# Patient Record
Sex: Female | Born: 1975 | Race: Black or African American | Hispanic: No | Marital: Married | Smoking: Never smoker
Health system: Southern US, Community
[De-identification: ages and names within clinical notes are randomized; demographics above are authoritative.]

## PROBLEM LIST (undated history)

## (undated) DIAGNOSIS — I10 Essential (primary) hypertension: Secondary | ICD-10-CM

## (undated) DIAGNOSIS — R079 Chest pain, unspecified: Secondary | ICD-10-CM

## (undated) DIAGNOSIS — T7840XA Allergy, unspecified, initial encounter: Secondary | ICD-10-CM

## (undated) HISTORY — DX: Allergy, unspecified, initial encounter: T78.40XA

## (undated) HISTORY — DX: Essential (primary) hypertension: I10

## (undated) HISTORY — DX: Chest pain, unspecified: R07.9

---

## 2007-05-18 ENCOUNTER — Emergency Department (HOSPITAL_COMMUNITY): Admission: EM | Admit: 2007-05-18 | Discharge: 2007-05-19 | Payer: Self-pay | Admitting: Emergency Medicine

## 2007-06-16 ENCOUNTER — Emergency Department (HOSPITAL_COMMUNITY): Admission: EM | Admit: 2007-06-16 | Discharge: 2007-06-16 | Payer: Self-pay | Admitting: Family Medicine

## 2008-01-21 ENCOUNTER — Emergency Department (HOSPITAL_COMMUNITY): Admission: EM | Admit: 2008-01-21 | Discharge: 2008-01-21 | Payer: Self-pay | Admitting: Emergency Medicine

## 2009-06-27 ENCOUNTER — Observation Stay (HOSPITAL_COMMUNITY): Admission: EM | Admit: 2009-06-27 | Discharge: 2009-06-28 | Payer: Self-pay | Admitting: Emergency Medicine

## 2009-06-27 ENCOUNTER — Emergency Department (HOSPITAL_COMMUNITY): Admission: EM | Admit: 2009-06-27 | Discharge: 2009-06-27 | Payer: Self-pay | Admitting: Emergency Medicine

## 2010-01-31 ENCOUNTER — Emergency Department (HOSPITAL_COMMUNITY): Admission: EM | Admit: 2010-01-31 | Discharge: 2010-01-31 | Payer: Self-pay | Admitting: Family Medicine

## 2010-08-18 LAB — POCT CARDIAC MARKERS: Troponin i, poc: <0.05 ng/mL (ref 0.00–0.09)

## 2010-08-18 LAB — BASIC METABOLIC PANEL
BUN: 4 mg/dL — ABNORMAL LOW (ref 6–23)
CO2: 21 mEq/L (ref 19–32)
CO2: 24 mEq/L (ref 19–32)
Calcium: 8.7 mg/dL (ref 8.4–10.5)
Chloride: 106 mEq/L (ref 96–112)
Creatinine, Ser: 0.65 mg/dL (ref 0.4–1.2)
GFR calc non Af Amer: >60 mL/min (ref >60)
Potassium: 3.4 mEq/L — ABNORMAL LOW (ref 3.5–5.1)
Potassium: 3.7 mEq/L (ref 3.5–5.1)
Sodium: 138 mEq/L (ref 135–145)

## 2010-08-18 LAB — CBC
HCT: 38.8 % (ref 36.0–46.0)
Hemoglobin: 13.4 g/dL (ref 12.0–15.0)
MCHC: 34.5 g/dL (ref 30.0–36.0)
RBC: 4.33 MIL/uL (ref 3.87–5.11)
RDW: 13.8 % (ref 11.5–15.5)

## 2010-08-18 LAB — DIFFERENTIAL
Lymphocytes Relative: 43 % (ref 12–46)
Monocytes Absolute: 0.4 10*3/uL (ref 0.1–1.0)
Neutro Abs: 2.9 10*3/uL (ref 1.7–7.7)
Neutrophils Relative %: 49 % (ref 43–77)

## 2011-09-08 ENCOUNTER — Emergency Department (HOSPITAL_COMMUNITY)
Admission: EM | Admit: 2011-09-08 | Discharge: 2011-09-08 | Disposition: A | Discharge status: Home / Self Care | Payer: BC Managed Care – PPO | Attending: Emergency Medicine | Admitting: Emergency Medicine

## 2011-09-08 ENCOUNTER — Encounter (HOSPITAL_COMMUNITY): Payer: Self-pay | Admitting: Emergency Medicine

## 2011-09-08 DIAGNOSIS — L299 Pruritus, unspecified: Secondary | ICD-10-CM | POA: Insufficient documentation

## 2011-09-08 DIAGNOSIS — L259 Unspecified contact dermatitis, unspecified cause: Secondary | ICD-10-CM | POA: Insufficient documentation

## 2011-09-08 DIAGNOSIS — T7840XA Allergy, unspecified, initial encounter: Secondary | ICD-10-CM

## 2011-09-08 MED ORDER — PREDNISONE 20 MG PO TABS: 20.0000 mg | ORAL_TABLET | Freq: Every day | ORAL | Status: AC

## 2011-09-08 MED ORDER — DIPHENHYDRAMINE HCL 25 MG PO CAPS
50.0000 mg | ORAL_CAPSULE | Freq: Once | ORAL | Status: AC
  Administered 2011-09-08: 50 mg via ORAL
  Filled 2011-09-08: qty 2

## 2011-09-08 NOTE — ED Notes (Signed)
Rx given x1 D/c instructions reviewed w/ pt and family - pt and family deny any further questions or concerns at present.  

## 2011-09-08 NOTE — Discharge Instructions (Signed)
Take the Benadryl every 4 hours as needed for rash or itching, take the prednisone for worsening symptoms, return to the hospital for severe or worsening symptoms  Allergic Reaction Allergic reactions can be caused by anything your body is sensitive to. Your body may be sensitive to food, medicines, molds, pollens, cockroaches, dust mites, pets, insect stings, and other things around you. An allergic reaction may cause puffiness (swelling), itching, sneezing, coughing, or problems breathing.  Allergies cannot be cured, but they can be controlled with medicine. Some allergies happen only at certain times of the year. Try to stay away from what causes your reaction if possible. Sometimes, it is hard to tell what causes your reaction. HOME CARE If you have a rash or red patches (hives) on your skin:  Take medicines as told by your doctor.   Do not drive or drink alcohol after taking medicines. They can make you sleepy.   Put cold cloths on your skin. Take baths in cool water. This will help your itching. Do not take hot baths or showers. Heat will make the itching worse.   If your allergies get worse, your doctor might give you other medicines. Talk to your doctor if problems continue.  GET HELP RIGHT AWAY IF:   You have trouble breathing.   You have a tight feeling in your chest or throat.   Your mouth gets puffy (swollen).   You have red, itchy patches on your skin (hives) that get worse.   You have itching all over your body.  MAKE SURE YOU:   Understand these instructions.   Will watch your condition.   Will get help right away if you are not doing well or get worse.  Document Released: 05/06/2009 Document Revised: 05/07/2011 Document Reviewed: 05/06/2009 [facility] Patient Information 2012 [place], [place].

## 2011-09-08 NOTE — ED Notes (Signed)
PT. REPORTS GENERALIZED ITCHY RASHES ONSET THIS EVENING ,  RESPIRATIONS UNLABORED , AIRWAY INTACT.

## 2011-09-08 NOTE — ED Provider Notes (Signed)
History     CSN: [id number]  Arrival date & time 09/08/11  0141   First MD Initiated Contact with Patient 09/08/11 [phone]      Chief Complaint  Patient presents with  . Rash    (Consider location/radiation/quality/duration/timing/severity/associated sxs/prior treatment) HPI Comments: 36 year old female who had acute onset of a generalized itchy rash that was raised and red and looked like hives that started prior to arrival this evening. The symptoms were persistent, improved significantly after Benadryl and were not associated with any swelling in the mouth, difficulty breathing or wheezing, changes in vision or lesions in the mouth. The symptoms are gone at this time. She denies any new medication exposures or topical exposures, list reviewed with patient, no findings  Patient is a 36 y.o. female presenting with rash. The history is provided by the patient and the spouse.  Rash     History reviewed. No pertinent past medical history.  History reviewed. No pertinent past surgical history.  No family history on file.  History  Substance Use Topics  . Smoking status: Never Smoker   . Smokeless tobacco: Not on file  . Alcohol Use: No    OB History    Grav Para Term Preterm Abortions TAB SAB Ect Mult Living                  Review of Systems  Constitutional: Negative for fever.  HENT: Negative for trouble swallowing.   Eyes: Negative for itching and visual disturbance.  Respiratory: Negative for cough and shortness of breath.   Cardiovascular: Negative for chest pain, palpitations and leg swelling.  Gastrointestinal: Negative for nausea and vomiting.  Skin: Positive for rash.    Allergies  Review of patient's allergies indicates no known allergies.  Home Medications   Current Outpatient Rx  Name Route Sig Dispense Refill  . PREDNISONE 20 MG PO TABS Oral Take 1 tablet (20 mg total) by mouth daily. 10 tablet 0    BP 137/86  Pulse 86  Temp(Src) 98.3 F (36.8 C)  (Oral)  Resp 19  SpO2 100%  LMP 09/02/2011  Physical Exam  Nursing note and vitals reviewed. Constitutional: She appears well-developed and well-nourished. No distress.  HENT:  Head: Normocephalic and atraumatic.  Mouth/Throat: Oropharynx is clear and moist. No oropharyngeal exudate.  Eyes: Conjunctivae and EOM are normal. Pupils are equal, round, and reactive to light. Right eye exhibits no discharge. Left eye exhibits no discharge. No scleral icterus.  Neck: Normal range of motion. Neck supple. No JVD present. No thyromegaly present.  Cardiovascular: Normal rate, regular rhythm, normal heart sounds and intact distal pulses.  Exam reveals no gallop and no friction rub.   No murmur heard. Pulmonary/Chest: Effort normal and breath sounds normal. No respiratory distress. She has no wheezes. She has no rales.  Abdominal: Soft. Bowel sounds are normal. She exhibits no distension and no mass. There is no tenderness.  Musculoskeletal: Normal range of motion. She exhibits no edema and no tenderness.  Lymphadenopathy:    She has no cervical adenopathy.  Neurological: She is alert. Coordination normal.  Skin: Skin is warm and dry. No rash noted. No erythema.       Several small excoriations, no breaking the skin, no urticarial lesions, no petechiae or purpura  Psychiatric: She has a normal mood and affect. Her behavior is normal.    ED Course  Procedures (including critical care time)  Labs Reviewed - No data to display No results found.   1.  Allergic reaction       MDM  Vital signs are normal, patient has had resolved rash after use of antihistamine, will give prednisone prescription to be used only as needed, Benadryl every 4 hours, no signs of Stevens-Johnson syndrome, no signs of acute exposure, patient encouraged to look for new exposures at home.        Vida Roller, MD 09/08/11 [phone]

## 2011-09-08 NOTE — ED Notes (Signed)
Pt reports acute onset this a.m. To neck and back - pt denies any known allergens, shortness of breath or swelling. Pt took benadryl at home and reports feeling better at present.

## 2012-03-30 ENCOUNTER — Encounter (HOSPITAL_COMMUNITY): Payer: Self-pay | Admitting: *Deleted

## 2012-03-30 ENCOUNTER — Emergency Department (INDEPENDENT_AMBULATORY_CARE_PROVIDER_SITE_OTHER)
Admission: EM | Admit: 2012-03-30 | Discharge: 2012-03-30 | Disposition: A | Discharge status: Home / Self Care | Payer: BC Managed Care – PPO | Source: Home / Self Care

## 2012-03-30 DIAGNOSIS — J069 Acute upper respiratory infection, unspecified: Secondary | ICD-10-CM

## 2012-03-30 DIAGNOSIS — J029 Acute pharyngitis, unspecified: Secondary | ICD-10-CM

## 2012-03-30 NOTE — ED Provider Notes (Addendum)
History     CSN: [id number]  Arrival date & time 03/30/12  0803   None     Chief Complaint  Patient presents with  . Sore Throat    (Consider location/radiation/quality/duration/timing/severity/associated sxs/prior treatment) HPI Comments: 36 year old female with sore throat: For one week. Initially she had a cough for a few days but that has since abated. she now has postpharyngeal drainage, it is worse at night she denies feve,r earache or GI symptoms.  Patient is a 36 y.o. female presenting with pharyngitis.  Sore Throat    History reviewed. No pertinent past medical history.  History reviewed. No pertinent past surgical history.  History reviewed. No pertinent family history.  History  Substance Use Topics  . Smoking status: Never Smoker   . Smokeless tobacco: Not on file  . Alcohol Use: No    OB History    Grav Para Term Preterm Abortions TAB SAB Ect Mult Living                  Review of Systems  Constitutional: Negative for fever, chills, activity change, appetite change and fatigue.  HENT: Positive for congestion, sore throat, rhinorrhea and postnasal drip. Negative for facial swelling, neck pain and neck stiffness.   Eyes: Negative.   Respiratory: Negative.   Cardiovascular: Negative.   Gastrointestinal: Negative.   Musculoskeletal: Negative.   Skin: Negative for pallor and rash.  Neurological: Negative.   Psychiatric/Behavioral: Negative.     Allergies  Latex  Home Medications  No current outpatient prescriptions on file.  BP 143/68  Pulse 114  Temp 98.8 F (37.1 C) (Oral)  Resp 16  SpO2 100%  LMP 03/28/2012  Physical Exam  Constitutional: She is oriented to person, place, and time. She appears well-developed and well-nourished. No distress.  HENT:  Head: Normocephalic and atraumatic.  Right Ear: External ear normal.  Left Ear: External ear normal.  Nose: Nose normal.  Mouth/Throat: No oropharyngeal exudate.       Oropharynx is  erythematous without petechia or exudates. Also clear PND  Eyes: EOM are normal. Pupils are equal, round, and reactive to light.  Neck: Normal range of motion. Neck supple.  Cardiovascular: Normal rate, regular rhythm and normal heart sounds.   Pulmonary/Chest: Effort normal and breath sounds normal. No respiratory distress. She has no wheezes.  Abdominal: Soft. There is no tenderness.  Musculoskeletal: Normal range of motion. She exhibits no edema.  Lymphadenopathy:    She has no cervical adenopathy.  Neurological: She is alert and oriented to person, place, and time. No cranial nerve deficit.  Skin: Skin is warm and dry. No rash noted.  Psychiatric: She has a normal mood and affect.    ED Course  Procedures (including critical care time)   Labs Reviewed  POCT RAPID STREP A ([facility] ONLY)   No results found.   1. URI (upper respiratory infection)   2. Pharyngitis       MDM  Cepacol lozenges for sore throat pain Ibuprofen 400 600 mg every 6 hours when necessary sore throat pain Recommend some sort of antihistamine such as Claritin or Benadryl for drainage. Plenty of fluids stay well hydrated   Results for orders placed during the hospital encounter of 03/30/12  POCT RAPID STREP A ([facility] ONLY)      Component Value Range   Streptococcus, Group A Screen (Direct) NEGATIVE  NEGATIVE        Hayden Rasmussen, NP 03/30/12 4098  Hayden Rasmussen, NP  03/30/12 1647  Hayden Rasmussen, NP 04/01/12 2242

## 2012-03-30 NOTE — ED Notes (Signed)
Pt  Reports  Symptoms  Of  sorethroat  And  Pain  When  She  Swallows    With  Chills   And   Sinus  Drainage  Down the  Back of  Her throat     Symptoms  Since  Last week

## 2012-03-31 NOTE — ED Provider Notes (Signed)
Medical screening examination/treatment/procedure(s) were performed by non-physician practitioner and as supervising physician I was immediately available for consultation/collaboration.   [facility]; MD   Sharin Grave, MD 03/31/12 [phone]

## 2012-04-02 NOTE — ED Provider Notes (Signed)
Medical screening examination/treatment/procedure(s) were performed by resident physician or non-physician practitioner and as supervising physician I was immediately available for consultation/collaboration.   Barkley Bruns MD.    Linna Hoff, MD 04/02/12 [phone]

## 2012-09-24 ENCOUNTER — Emergency Department (HOSPITAL_COMMUNITY)
Admission: EM | Admit: 2012-09-24 | Discharge: 2012-09-24 | Disposition: A | Discharge status: Home / Self Care | Payer: BC Managed Care – PPO | Attending: Emergency Medicine | Admitting: Emergency Medicine

## 2012-09-24 ENCOUNTER — Encounter (HOSPITAL_COMMUNITY): Payer: Self-pay | Admitting: Physical Medicine and Rehabilitation

## 2012-09-24 ENCOUNTER — Emergency Department (HOSPITAL_COMMUNITY): Payer: BC Managed Care – PPO

## 2012-09-24 DIAGNOSIS — Z91040 Latex allergy status: Secondary | ICD-10-CM | POA: Insufficient documentation

## 2012-09-24 DIAGNOSIS — Z3202 Encounter for pregnancy test, result negative: Secondary | ICD-10-CM | POA: Insufficient documentation

## 2012-09-24 DIAGNOSIS — R300 Dysuria: Secondary | ICD-10-CM | POA: Insufficient documentation

## 2012-09-24 DIAGNOSIS — R109 Unspecified abdominal pain: Secondary | ICD-10-CM | POA: Insufficient documentation

## 2012-09-24 DIAGNOSIS — N12 Tubulo-interstitial nephritis, not specified as acute or chronic: Secondary | ICD-10-CM

## 2012-09-24 DIAGNOSIS — N10 Acute tubulo-interstitial nephritis: Secondary | ICD-10-CM | POA: Insufficient documentation

## 2012-09-24 LAB — CBC WITH DIFFERENTIAL/PLATELET
Basophils Relative: 0 % (ref 0–1)
Lymphocytes Relative: 23 % (ref 12–46)
MCHC: 35 g/dL (ref 30.0–36.0)
Neutro Abs: 6 10*3/uL (ref 1.7–7.7)
Neutrophils Relative %: 68 % (ref 43–77)
RDW: 13.4 % (ref 11.5–15.5)
WBC: 8.8 10*3/uL (ref 4.0–10.5)

## 2012-09-24 LAB — COMPREHENSIVE METABOLIC PANEL
AST: 13 U/L (ref 0–37)
Albumin: 3.5 g/dL (ref 3.5–5.2)
Calcium: 9.1 mg/dL (ref 8.4–10.5)
Creatinine, Ser: 0.72 mg/dL (ref 0.50–1.10)
GFR calc Af Amer: >90 mL/min (ref >90)
GFR calc non Af Amer: >90 mL/min (ref >90)

## 2012-09-24 LAB — URINALYSIS, ROUTINE W REFLEX MICROSCOPIC
Glucose, UA: NEGATIVE mg/dL
Ketones, ur: NEGATIVE mg/dL
Protein, ur: NEGATIVE mg/dL
pH: 7.5 (ref 5.0–8.0)

## 2012-09-24 LAB — POCT PREGNANCY, URINE: Preg Test, Ur: NEGATIVE

## 2012-09-24 MED ORDER — CEPHALEXIN 500 MG PO CAPS: 500.0000 mg | ORAL_CAPSULE | Freq: Four times a day (QID) | ORAL | Status: DC

## 2012-09-24 MED ORDER — KETOROLAC TROMETHAMINE 30 MG/ML IJ SOLN
INTRAMUSCULAR | Status: AC
  Administered 2012-09-24: 30 mg via INTRAVENOUS
  Filled 2012-09-24: qty 1

## 2012-09-24 MED ORDER — OXYCODONE-ACETAMINOPHEN 5-325 MG PO TABS: ORAL_TABLET | ORAL | Status: DC

## 2012-09-24 MED ORDER — KETOROLAC TROMETHAMINE 30 MG/ML IJ SOLN: 30.0000 mg | Freq: Once | INTRAMUSCULAR | Status: AC

## 2012-09-24 MED ORDER — ONDANSETRON HCL 4 MG/2ML IJ SOLN
4.0000 mg | Freq: Once | INTRAMUSCULAR | Status: AC
  Administered 2012-09-24: 4 mg via INTRAVENOUS
  Filled 2012-09-24: qty 2

## 2012-09-24 MED ORDER — PROMETHAZINE HCL 25 MG PO TABS: 25.0000 mg | ORAL_TABLET | Freq: Four times a day (QID) | ORAL | Status: DC | PRN

## 2012-09-24 MED ORDER — HYDROMORPHONE HCL PF 1 MG/ML IJ SOLN
0.5000 mg | Freq: Once | INTRAMUSCULAR | Status: AC
  Administered 2012-09-24: 0.5 mg via INTRAVENOUS
  Filled 2012-09-24: qty 1

## 2012-09-24 MED ORDER — DEXTROSE 5 % IV SOLN
1.0000 g | Freq: Once | INTRAVENOUS | Status: AC
  Administered 2012-09-24: 1 g via INTRAVENOUS
  Filled 2012-09-24: qty 10

## 2012-09-24 NOTE — ED Provider Notes (Signed)
37 year old female history of occasional mild urinary infections in the past who presents with right-sided flank pain, on exam has a soft abdomen but a tender right flank, no peritoneal signs, no guarding, well-appearing. Her urinalysis shows significant leukocytosis but no signs of bacteria, CT scan to evaluate for kidney stone shows no obvious kidney stones but does show a abnormality of the right collecting system. The patient has been informed of these results and encouraged to followup with her family doctor who she will see on Monday. All questions have been answered, patient expresses understanding, stable for discharge after antibiotics given by IV.  Medical screening examination/treatment/procedure(s) were conducted as a shared visit with non-physician practitioner(s) and myself.  I personally evaluated the patient during the encounter    Vida Roller, MD 09/24/12 1424

## 2012-09-24 NOTE — ED Notes (Signed)
Pt presents to department for evaluation of decreased urinary frequency and R sided flank pain. Ongoing x1 day. 5/10 pain at the time. No nausea/vomiting and diarrhea. LMP:09/18/2012. Pt is alert and oriented x4.

## 2012-09-24 NOTE — ED Provider Notes (Signed)
History     CSN: [id number]  Arrival date & time 09/24/12  1133   First MD Initiated Contact with Patient 09/24/12 1211      Chief Complaint  Patient presents with  . Urinary Retention  . Flank Pain    (Consider location/radiation/quality/duration/timing/severity/associated sxs/prior treatment) HPI  Jennifer Hatfield is a 37 y.o. female complaining of acute onset of right flank pain, non radiating,  last night significantly worsening today. Pain is described as 10 out of 10, waxing and waning, non-positional. Patient denies fever, nausea vomiting, prior episodes, hematuria, h/o nephrolithiasis. She does state that she has difficulty urinating and reduced urinary output.   No past medical history on file.  No past surgical history on file.  No family history on file.  History  Substance Use Topics  . Smoking status: Never Smoker   . Smokeless tobacco: Not on file  . Alcohol Use: No    OB History   Grav Para Term Preterm Abortions TAB SAB Ect Mult Living                  Review of Systems  Constitutional: Negative for fever and chills.  Respiratory: Negative for shortness of breath.   Cardiovascular: Negative for chest pain.  Gastrointestinal: Negative for nausea, vomiting, abdominal pain and diarrhea.  Genitourinary: Positive for flank pain and difficulty urinating. Negative for dysuria, frequency and hematuria.  All other systems reviewed and are negative.    Allergies  Latex  Home Medications  No current outpatient prescriptions on file.  BP 144/74  Pulse 103  Temp(Src) 98.8 F (37.1 C) (Oral)  Resp 18  SpO2 100%  Physical Exam  Nursing note and vitals reviewed. Constitutional: She is oriented to person, place, and time. She appears well-developed and well-nourished. No distress.  Non toxic appears acutely uncomfortable  HENT:  Head: Normocephalic.  Mouth/Throat: Oropharynx is clear and moist.  Eyes: Conjunctivae and EOM are normal.    Cardiovascular: Normal rate.   Pulmonary/Chest: Effort normal and breath sounds normal. No stridor. No respiratory distress. She has no wheezes. She has no rales. She exhibits no tenderness.  Abdominal: Soft. Bowel sounds are normal. She exhibits no distension and no mass. There is tenderness. There is no rebound and no guarding.    Genitourinary:  No CVA TTP bilateral  Musculoskeletal: Normal range of motion.  Neurological: She is alert and oriented to person, place, and time.  Psychiatric: She has a normal mood and affect.    ED Course  Procedures (including critical care time)  Labs Reviewed  URINALYSIS, ROUTINE W REFLEX MICROSCOPIC - Abnormal; Notable for the following:    APPearance CLOUDY (*)    Hgb urine dipstick MODERATE (*)    Leukocytes, UA LARGE (*)    All other components within normal limits  COMPREHENSIVE METABOLIC PANEL - Abnormal; Notable for the following:    Glucose, Bld 100 (*)    All other components within normal limits  URINE MICROSCOPIC-ADD ON - Abnormal; Notable for the following:    Bacteria, UA FEW (*)    All other components within normal limits  URINE CULTURE  CBC WITH DIFFERENTIAL  POCT PREGNANCY, URINE   Ct Abdomen Pelvis Wo Contrast  09/24/2012  *RADIOLOGY REPORT*  Clinical Data: Acute right flank pain.  Hematuria.  CT ABDOMEN AND PELVIS WITHOUT CONTRAST  Technique:  Multidetector CT imaging of the abdomen and pelvis was performed following the standard protocol without intravenous contrast.  Comparison: None.  Findings: The visualized portion  of the liver, spleen, pancreas, and adrenal glands appear unremarkable in noncontrast CT appearance.  Dependent density the gallbladder could be from gallstones or sludge.  Slight fullness of the right collecting system noted.  I cannot exclude wall thickening in the right collecting system. However, no stone is currently visualized, and there is no definite hydroureter.  The appendix appears normal.  Left kidney  unremarkable.  Urinary bladder normal.  Scattered air-fluid levels are present in the duodenum and jejunum.  Possible posterior uterine fibroid on the right.  Right ovary somewhat indistinct.  No ascites observed. No pathologic retroperitoneal or porta hepatis adenopathy is identified.  IMPRESSION:  1.  No stones identified, but there is a suggestion of wall thickening and / or mild fullness of the right collecting system, query inflammation in this vicinity or recently passed calculus. If the patient has hematuria is not clear, this may require further workup including contrast enhanced imaging to exclude tumor. 2.  Dependent density in the gallbladder may reflect sludge or layering gallstones. 3.  Proximal enteritis cannot be excluded. 4.  The appendix appears normal. 5.  Suspected fibroid along the right posterior uterine body.   Original Report Authenticated By: Gaylyn Rong, M.D.     1:50 PM saw evaluated patient at bedside. She states her pain is significantly improved, she appears much more comfortable.   1. Pyelonephritis       MDM   Jennifer Hatfield is a 37 y.o. female with acute onset of severe right flank/lateral abdominal pain and reduced urine output. Patient is afebrile, nontoxic appearing with no nausea or vomiting.  Urinalysis is consistent with infection and CT stone protocol ordered to rule out kidney stone.  CT does not show any stones or hydronephrosis, however there is a thickening or fullness to the right collecting system. I have discussed this with attending who personally evaluated the patient during this encounter.  I have discussed the CT result with the patient and informed her that there is an abnormality that may require further imaging with IV contrast. I have explained her time going to be treating her for pyelonephritis and strict return precautions were given and repeated back to me. Patient has an appointment with her primary care physician on Monday in the  a.m. I have printed out results for her to take with her to her PCP.  This is a shared visit with the attending physician who personally evaluated the patient and agrees with the care plan.    Filed Vitals:   09/24/12 1145  BP: 144/74  Pulse: 103  Temp: 98.8 F (37.1 C)  TempSrc: Oral  Resp: 18  SpO2: 100%     Pt verbalized understanding and agrees with care plan. Outpatient follow-up and return precautions given.    New Prescriptions   CEPHALEXIN (KEFLEX) 500 MG CAPSULE    Take 1 capsule (500 mg total) by mouth 4 (four) times daily.   OXYCODONE-ACETAMINOPHEN (PERCOCET/ROXICET) 5-325 MG PER TABLET    1 to 2 tabs PO q6hrs  PRN for pain   PROMETHAZINE (PHENERGAN) 25 MG TABLET    Take 1 tablet (25 mg total) by mouth every 6 (six) hours as needed for nausea.           Wynetta Emery, PA-C 09/24/12 1550

## 2012-09-25 NOTE — ED Provider Notes (Signed)
Medical screening examination/treatment/procedure(s) were conducted as a shared visit with non-physician practitioner(s) and myself.  I personally evaluated the patient during the encounter  Please see my separate respective documentation pertaining to this patient encounter   Vida Roller, MD 09/25/12 1101

## 2012-09-26 LAB — URINE CULTURE

## 2012-09-27 ENCOUNTER — Telehealth (HOSPITAL_COMMUNITY): Payer: Self-pay | Admitting: Emergency Medicine

## 2012-09-27 NOTE — ED Notes (Signed)
Results received from [facility]. (+) [facility]  Rx given in ED for Keflex -> sensitive to the same.  Chart appended per protocol. 

## 2012-09-30 ENCOUNTER — Other Ambulatory Visit (HOSPITAL_COMMUNITY): Payer: Self-pay | Admitting: Internal Medicine

## 2012-09-30 DIAGNOSIS — Z1231 Encounter for screening mammogram for malignant neoplasm of breast: Secondary | ICD-10-CM

## 2012-10-05 ENCOUNTER — Ambulatory Visit (HOSPITAL_COMMUNITY)
Admission: RE | Admit: 2012-10-05 | Discharge: 2012-10-05 | Disposition: A | Discharge status: Home / Self Care | Payer: BC Managed Care – PPO | Source: Ambulatory Visit | Attending: Internal Medicine | Admitting: Internal Medicine

## 2012-10-05 DIAGNOSIS — Z1231 Encounter for screening mammogram for malignant neoplasm of breast: Secondary | ICD-10-CM | POA: Insufficient documentation

## 2012-11-25 ENCOUNTER — Encounter: Payer: Self-pay | Admitting: Obstetrics and Gynecology

## 2012-11-25 ENCOUNTER — Ambulatory Visit (INDEPENDENT_AMBULATORY_CARE_PROVIDER_SITE_OTHER): Payer: BC Managed Care – PPO | Admitting: Obstetrics and Gynecology

## 2012-11-25 VITALS — BP 122/80 | HR 78 | Temp 98.1°F | Ht 64.0 in | Wt 137.8 lb

## 2012-11-25 DIAGNOSIS — D259 Leiomyoma of uterus, unspecified: Secondary | ICD-10-CM

## 2012-11-25 DIAGNOSIS — Z01419 Encounter for gynecological examination (general) (routine) without abnormal findings: Secondary | ICD-10-CM

## 2012-11-25 NOTE — Progress Notes (Signed)
  Subjective:     Jennifer Hatfield is a 37 y.o. female G1P1 with LMP 10/25/2012 and BMI 23 who is here for a comprehensive physical exam. The patient reports no problems. Patient describes normal monthly cycles lasting 4 days with normal flow. She denies severe dysmenorrhea. Patient is sexually active using vasectomy for contraception  Past Medical History  Diagnosis Date  . Allergy     latex   History reviewed. No pertinent past surgical history. Family History  Problem Relation Age of Onset  . Cancer Mother     breast cancer  . Cancer Maternal Aunt   . Cancer Paternal Aunt     breast   History  Substance Use Topics  . Smoking status: Never Smoker   . Smokeless tobacco: Not on file  . Alcohol Use: No    History   Social History  . Marital Status: Married    Spouse Name: N/A    Number of Children: N/A  . Years of Education: N/A   Occupational History  . Not on file.   Social History Main Topics  . Smoking status: Never Smoker   . Smokeless tobacco: Not on file  . Alcohol Use: No  . Drug Use: No  . Sexually Active: Yes    Birth Control/ Protection: None   Other Topics Concern  . Not on file   Social History Narrative  . No narrative on file   No health maintenance topics applied.     Review of Systems A comprehensive review of systems was negative.   Objective:      GENERAL: Well-developed, well-nourished female in no acute distress.  HEENT: Normocephalic, atraumatic. Sclerae anicteric.  NECK: Supple. Normal thyroid.  LUNGS: Clear to auscultation bilaterally.  HEART: Regular rate and rhythm. BREASTS: Symmetric in size. No palpable masses or lymphadenopathy, skin changes, or nipple drainage. ABDOMEN: Soft, nontender, nondistended. No organomegaly. PELVIC: Normal external female genitalia. Vagina is pink and rugated.  Normal discharge. Normal appearing cervix. Uterus is normal in size.  No adnexal mass or tenderness. EXTREMITIES: No cyanosis, clubbing,  or edema, 2+ distal pulses.    Assessment:    Healthy female exam.      Plan:    pap smear collected Patient informed of posterior fibroid seen on CT scan. Patient currently asymptomatic so no intervention are needed Patient advised to perform monthly self breast and vulva exams Discussed BRCA testing with patient given family history- Patient will consider it but is not interested at this time See After Visit Summary for Counseling Recommendations

## 2012-11-25 NOTE — Patient Instructions (Signed)
Preventive Care for Adults, Female A healthy lifestyle and preventive care can promote health and wellness. Preventive health guidelines for women include the following key practices.  A routine yearly physical is a good way to check with your caregiver about your health and preventive screening. It is a chance to share any concerns and updates on your health, and to receive a thorough exam.  Visit your dentist for a routine exam and preventive care every 6 months. Brush your teeth twice a day and floss once a day. Good oral hygiene prevents tooth decay and gum disease.  The frequency of eye exams is based on your age, health, family medical history, use of contact lenses, and other factors. Follow your caregiver's recommendations for frequency of eye exams.  Eat a healthy diet. Foods like vegetables, fruits, whole grains, low-fat dairy products, and lean protein foods contain the nutrients you need without too many calories. Decrease your intake of foods high in solid fats, added sugars, and salt. Eat the right amount of calories for you.Get information about a proper diet from your caregiver, if necessary.  Regular physical exercise is one of the most important things you can do for your health. Most adults should get at least 150 minutes of moderate-intensity exercise (any activity that increases your heart rate and causes you to sweat) each week. In addition, most adults need muscle-strengthening exercises on 2 or more days a week.  Maintain a healthy weight. The body mass index (BMI) is a screening tool to identify possible weight problems. It provides an estimate of body fat based on height and weight. Your caregiver can help determine your BMI, and can help you achieve or maintain a healthy weight.For adults 20 years and older:  A BMI below 18.5 is considered underweight.  A BMI of 18.5 to 24.9 is normal.  A BMI of 25 to 29.9 is considered overweight.  A BMI of 30 and above is  considered obese.  Maintain normal blood lipids and cholesterol levels by exercising and minimizing your intake of saturated fat. Eat a balanced diet with plenty of fruit and vegetables. Blood tests for lipids and cholesterol should begin at age 20 and be repeated every 5 years. If your lipid or cholesterol levels are high, you are over 50, or you are at high risk for heart disease, you may need your cholesterol levels checked more frequently.Ongoing high lipid and cholesterol levels should be treated with medicines if diet and exercise are not effective.  If you smoke, find out from your caregiver how to quit. If you do not use tobacco, do not start.  If you are pregnant, do not drink alcohol. If you are breastfeeding, be very cautious about drinking alcohol. If you are not pregnant and choose to drink alcohol, do not exceed 1 drink per day. One drink is considered to be 12 ounces (355 mL) of beer, 5 ounces (148 mL) of wine, or 1.5 ounces (44 mL) of liquor.  Avoid use of street drugs. Do not share needles with anyone. Ask for help if you need support or instructions about stopping the use of drugs.  High blood pressure causes heart disease and increases the risk of stroke. Your blood pressure should be checked at least every 1 to 2 years. Ongoing high blood pressure should be treated with medicines if weight loss and exercise are not effective.  If you are 55 to 39 years old, ask your caregiver if you should take aspirin to prevent strokes.  Diabetes   screening involves taking a blood sample to check your fasting blood sugar level. This should be done once every 3 years, after age 45, if you are within normal weight and without risk factors for diabetes. Testing should be considered at a younger age or be carried out more frequently if you are overweight and have at least 1 risk factor for diabetes.  Breast cancer screening is essential preventive care for women. You should practice "breast  self-awareness." This means understanding the normal appearance and feel of your breasts and may include breast self-examination. Any changes detected, no matter how small, should be reported to a caregiver. Women in their 20s and 30s should have a clinical breast exam (CBE) by a caregiver as part of a regular health exam every 1 to 3 years. After age 40, women should have a CBE every year. Starting at age 40, women should consider having a mammography (breast X-ray test) every year. Women who have a family history of breast cancer should talk to their caregiver about genetic screening. Women at a high risk of breast cancer should talk to their caregivers about having magnetic resonance imaging (MRI) and a mammography every year.  The Pap test is a screening test for cervical cancer. A Pap test can show cell changes on the cervix that might become cervical cancer if left untreated. A Pap test is a procedure in which cells are obtained and examined from the lower end of the uterus (cervix).  Women should have a Pap test starting at age 21.  Between ages 21 and 29, Pap tests should be repeated every 2 years.  Beginning at age 30, you should have a Pap test every 3 years as long as the past 3 Pap tests have been normal.  Some women have medical problems that increase the chance of getting cervical cancer. Talk to your caregiver about these problems. It is especially important to talk to your caregiver if a new problem develops soon after your last Pap test. In these cases, your caregiver may recommend more frequent screening and Pap tests.  The above recommendations are the same for women who have or have not gotten the vaccine for human papillomavirus (HPV).  If you had a hysterectomy for a problem that was not cancer or a condition that could lead to cancer, then you no longer need Pap tests. Even if you no longer need a Pap test, a regular exam is a good idea to make sure no other problems are  starting.  If you are between ages 65 and 70, and you have had normal Pap tests going back 10 years, you no longer need Pap tests. Even if you no longer need a Pap test, a regular exam is a good idea to make sure no other problems are starting.  If you have had past treatment for cervical cancer or a condition that could lead to cancer, you need Pap tests and screening for cancer for at least 20 years after your treatment.  If Pap tests have been discontinued, risk factors (such as a new sexual partner) need to be reassessed to determine if screening should be resumed.  The HPV test is an additional test that may be used for cervical cancer screening. The HPV test looks for the virus that can cause the cell changes on the cervix. The cells collected during the Pap test can be tested for HPV. The HPV test could be used to screen women aged 30 years and older, and should   be used in women of any age who have unclear Pap test results. After the age of 30, women should have HPV testing at the same frequency as a Pap test.  Colorectal cancer can be detected and often prevented. Most routine colorectal cancer screening begins at the age of 50 and continues through age 75. However, your caregiver may recommend screening at an earlier age if you have risk factors for colon cancer. On a yearly basis, your caregiver may provide home test kits to check for hidden blood in the stool. Use of a small camera at the end of a tube, to directly examine the colon (sigmoidoscopy or colonoscopy), can detect the earliest forms of colorectal cancer. Talk to your caregiver about this at age 50, when routine screening begins. Direct examination of the colon should be repeated every 5 to 10 years through age 75, unless early forms of pre-cancerous polyps or small growths are found.  Hepatitis C blood testing is recommended for all people born from 1945 through 1965 and any individual with known risks for hepatitis C.  Practice  safe sex. Use condoms and avoid high-risk sexual practices to reduce the spread of sexually transmitted infections (STIs). STIs include gonorrhea, chlamydia, syphilis, trichomonas, herpes, HPV, and human immunodeficiency virus (HIV). Herpes, HIV, and HPV are viral illnesses that have no cure. They can result in disability, cancer, and death. Sexually active women aged 25 and younger should be checked for chlamydia. Older women with new or multiple partners should also be tested for chlamydia. Testing for other STIs is recommended if you are sexually active and at increased risk.  Osteoporosis is a disease in which the bones lose minerals and strength with aging. This can result in serious bone fractures. The risk of osteoporosis can be identified using a bone density scan. Women ages 65 and over and women at risk for fractures or osteoporosis should discuss screening with their caregivers. Ask your caregiver whether you should take a calcium supplement or vitamin D to reduce the rate of osteoporosis.  Menopause can be associated with physical symptoms and risks. Hormone replacement therapy is available to decrease symptoms and risks. You should talk to your caregiver about whether hormone replacement therapy is right for you.  Use sunscreen with sun protection factor (SPF) of 30 or more. Apply sunscreen liberally and repeatedly throughout the day. You should seek shade when your shadow is shorter than you. Protect yourself by wearing long sleeves, pants, a wide-brimmed hat, and sunglasses year round, whenever you are outdoors.  Once a month, do a whole body skin exam, using a mirror to look at the skin on your back. Notify your caregiver of new moles, moles that have irregular borders, moles that are larger than a pencil eraser, or moles that have changed in shape or color.  Stay current with required immunizations.  Influenza. You need a dose every fall (or winter). The composition of the flu vaccine  changes each year, so being vaccinated once is not enough.  Pneumococcal polysaccharide. You need 1 to 2 doses if you smoke cigarettes or if you have certain chronic medical conditions. You need 1 dose at age 65 (or older) if you have never been vaccinated.  Tetanus, diphtheria, pertussis (Tdap, Td). Get 1 dose of Tdap vaccine if you are younger than age 65, are over 65 and have contact with an infant, are a healthcare worker, are pregnant, or simply want to be protected from whooping cough. After that, you need a Td   booster dose every 10 years. Consult your caregiver if you have not had at least 3 tetanus and diphtheria-containing shots sometime in your life or have a deep or dirty wound.  HPV. You need this vaccine if you are a woman age 26 or younger. The vaccine is given in 3 doses over 6 months.  Measles, mumps, rubella (MMR). You need at least 1 dose of MMR if you were born in 1957 or later. You may also need a second dose.  Meningococcal. If you are age 19 to 21 and a first-year college student living in a residence hall, or have one of several medical conditions, you need to get vaccinated against meningococcal disease. You may also need additional booster doses.  Zoster (shingles). If you are age 60 or older, you should get this vaccine.  Varicella (chickenpox). If you have never had chickenpox or you were vaccinated but received only 1 dose, talk to your caregiver to find out if you need this vaccine.  Hepatitis A. You need this vaccine if you have a specific risk factor for hepatitis A virus infection or you simply wish to be protected from this disease. The vaccine is usually given as 2 doses, 6 to 18 months apart.  Hepatitis B. You need this vaccine if you have a specific risk factor for hepatitis B virus infection or you simply wish to be protected from this disease. The vaccine is given in 3 doses, usually over 6 months. Preventive Services / Frequency Ages 19 to 39  Blood  pressure check.** / Every 1 to 2 years.  Lipid and cholesterol check.** / Every 5 years beginning at age 20.  Clinical breast exam.** / Every 3 years for women in their 20s and 30s.  Pap test.** / Every 2 years from ages 21 through 29. Every 3 years starting at age 30 through age 65 or 70 with a history of 3 consecutive normal Pap tests.  HPV screening.** / Every 3 years from ages 30 through ages 65 to 70 with a history of 3 consecutive normal Pap tests.  Hepatitis C blood test.** / For any individual with known risks for hepatitis C.  Skin self-exam. / Monthly.  Influenza immunization.** / Every year.  Pneumococcal polysaccharide immunization.** / 1 to 2 doses if you smoke cigarettes or if you have certain chronic medical conditions.  Tetanus, diphtheria, pertussis (Tdap, Td) immunization. / A one-time dose of Tdap vaccine. After that, you need a Td booster dose every 10 years.  HPV immunization. / 3 doses over 6 months, if you are 26 and younger.  Measles, mumps, rubella (MMR) immunization. / You need at least 1 dose of MMR if you were born in 1957 or later. You may also need a second dose.  Meningococcal immunization. / 1 dose if you are age 19 to 21 and a first-year college student living in a residence hall, or have one of several medical conditions, you need to get vaccinated against meningococcal disease. You may also need additional booster doses.  Varicella immunization.** / Consult your caregiver.  Hepatitis A immunization.** / Consult your caregiver. 2 doses, 6 to 18 months apart.  Hepatitis B immunization.** / Consult your caregiver. 3 doses usually over 6 months. Ages 40 to 64  Blood pressure check.** / Every 1 to 2 years.  Lipid and cholesterol check.** / Every 5 years beginning at age 20.  Clinical breast exam.** / Every year after age 40.  Mammogram.** / Every year beginning at age 40   and continuing for as long as you are in good health. Consult with your  caregiver.  Pap test.** / Every 3 years starting at age 30 through age 65 or 70 with a history of 3 consecutive normal Pap tests.  HPV screening.** / Every 3 years from ages 30 through ages 65 to 70 with a history of 3 consecutive normal Pap tests.  Fecal occult blood test (FOBT) of stool. / Every year beginning at age 50 and continuing until age 75. You may not need to do this test if you get a colonoscopy every 10 years.  Flexible sigmoidoscopy or colonoscopy.** / Every 5 years for a flexible sigmoidoscopy or every 10 years for a colonoscopy beginning at age 50 and continuing until age 75.  Hepatitis C blood test.** / For all people born from 1945 through 1965 and any individual with known risks for hepatitis C.  Skin self-exam. / Monthly.  Influenza immunization.** / Every year.  Pneumococcal polysaccharide immunization.** / 1 to 2 doses if you smoke cigarettes or if you have certain chronic medical conditions.  Tetanus, diphtheria, pertussis (Tdap, Td) immunization.** / A one-time dose of Tdap vaccine. After that, you need a Td booster dose every 10 years.  Measles, mumps, rubella (MMR) immunization. / You need at least 1 dose of MMR if you were born in 1957 or later. You may also need a second dose.  Varicella immunization.** / Consult your caregiver.  Meningococcal immunization.** / Consult your caregiver.  Hepatitis A immunization.** / Consult your caregiver. 2 doses, 6 to 18 months apart.  Hepatitis B immunization.** / Consult your caregiver. 3 doses, usually over 6 months. Ages 65 and over  Blood pressure check.** / Every 1 to 2 years.  Lipid and cholesterol check.** / Every 5 years beginning at age 20.  Clinical breast exam.** / Every year after age 40.  Mammogram.** / Every year beginning at age 40 and continuing for as long as you are in good health. Consult with your caregiver.  Pap test.** / Every 3 years starting at age 30 through age 65 or 70 with a 3  consecutive normal Pap tests. Testing can be stopped between 65 and 70 with 3 consecutive normal Pap tests and no abnormal Pap or HPV tests in the past 10 years.  HPV screening.** / Every 3 years from ages 30 through ages 65 or 70 with a history of 3 consecutive normal Pap tests. Testing can be stopped between 65 and 70 with 3 consecutive normal Pap tests and no abnormal Pap or HPV tests in the past 10 years.  Fecal occult blood test (FOBT) of stool. / Every year beginning at age 50 and continuing until age 75. You may not need to do this test if you get a colonoscopy every 10 years.  Flexible sigmoidoscopy or colonoscopy.** / Every 5 years for a flexible sigmoidoscopy or every 10 years for a colonoscopy beginning at age 50 and continuing until age 75.  Hepatitis C blood test.** / For all people born from 1945 through 1965 and any individual with known risks for hepatitis C.  Osteoporosis screening.** / A one-time screening for women ages 65 and over and women at risk for fractures or osteoporosis.  Skin self-exam. / Monthly.  Influenza immunization.** / Every year.  Pneumococcal polysaccharide immunization.** / 1 dose at age 65 (or older) if you have never been vaccinated.  Tetanus, diphtheria, pertussis (Tdap, Td) immunization. / A one-time dose of Tdap vaccine if you are over   65 and have contact with an infant, are a healthcare worker, or simply want to be protected from whooping cough. After that, you need a Td booster dose every 10 years.  Varicella immunization.** / Consult your caregiver.  Meningococcal immunization.** / Consult your caregiver.  Hepatitis A immunization.** / Consult your caregiver. 2 doses, 6 to 18 months apart.  Hepatitis B immunization.** / Check with your caregiver. 3 doses, usually over 6 months. ** Family history and personal history of risk and conditions may change your caregiver's recommendations. Document Released: 07/14/2001 Document Revised: 08/10/2011  Document Reviewed: 10/13/2010 [employer] Patient Information 2014 [employer].  

## 2013-05-29 ENCOUNTER — Other Ambulatory Visit: Payer: Self-pay | Admitting: Nurse Practitioner

## 2013-05-29 DIAGNOSIS — N63 Unspecified lump in unspecified breast: Secondary | ICD-10-CM

## 2013-06-05 ENCOUNTER — Ambulatory Visit
Admission: RE | Admit: 2013-06-05 | Discharge: 2013-06-05 | Disposition: A | Discharge status: Home / Self Care | Payer: BC Managed Care – PPO | Source: Ambulatory Visit | Attending: Family Medicine | Admitting: Family Medicine

## 2013-06-05 ENCOUNTER — Ambulatory Visit
Admission: RE | Admit: 2013-06-05 | Discharge: 2013-06-05 | Disposition: A | Discharge status: Home / Self Care | Payer: BC Managed Care – PPO | Source: Ambulatory Visit | Attending: Nurse Practitioner | Admitting: Nurse Practitioner

## 2013-06-05 ENCOUNTER — Other Ambulatory Visit: Payer: Self-pay | Admitting: Family Medicine

## 2013-06-05 DIAGNOSIS — N63 Unspecified lump in unspecified breast: Secondary | ICD-10-CM

## 2013-06-05 DIAGNOSIS — N644 Mastodynia: Secondary | ICD-10-CM

## 2014-04-02 ENCOUNTER — Encounter: Payer: Self-pay | Admitting: Obstetrics and Gynecology

## 2015-06-27 ENCOUNTER — Other Ambulatory Visit: Payer: Self-pay

## 2015-06-27 DIAGNOSIS — Z1231 Encounter for screening mammogram for malignant neoplasm of breast: Secondary | ICD-10-CM

## 2015-07-09 ENCOUNTER — Other Ambulatory Visit (HOSPITAL_COMMUNITY)
Admission: RE | Admit: 2015-07-09 | Discharge: 2015-07-09 | Disposition: A | Discharge status: Home / Self Care | Payer: BC Managed Care – PPO | Source: Ambulatory Visit | Attending: Nurse Practitioner | Admitting: Nurse Practitioner

## 2015-07-09 ENCOUNTER — Other Ambulatory Visit: Payer: Self-pay | Admitting: Nurse Practitioner

## 2015-07-09 DIAGNOSIS — Z1151 Encounter for screening for human papillomavirus (HPV): Secondary | ICD-10-CM | POA: Diagnosis not present

## 2015-07-09 DIAGNOSIS — Z01419 Encounter for gynecological examination (general) (routine) without abnormal findings: Secondary | ICD-10-CM | POA: Diagnosis present

## 2015-07-11 LAB — CYTOLOGY - PAP

## 2015-07-16 ENCOUNTER — Ambulatory Visit
Admission: RE | Admit: 2015-07-16 | Discharge: 2015-07-16 | Disposition: A | Discharge status: Home / Self Care | Payer: BC Managed Care – PPO | Source: Ambulatory Visit

## 2015-07-16 DIAGNOSIS — Z1231 Encounter for screening mammogram for malignant neoplasm of breast: Secondary | ICD-10-CM

## 2017-11-29 ENCOUNTER — Emergency Department (HOSPITAL_COMMUNITY)
Admission: EM | Admit: 2017-11-29 | Discharge: 2017-11-30 | Disposition: A | Discharge status: Home / Self Care | Payer: BC Managed Care – PPO | Attending: Emergency Medicine | Admitting: Emergency Medicine

## 2017-11-29 ENCOUNTER — Emergency Department (HOSPITAL_COMMUNITY): Payer: BC Managed Care – PPO

## 2017-11-29 ENCOUNTER — Other Ambulatory Visit: Payer: Self-pay

## 2017-11-29 DIAGNOSIS — R1310 Dysphagia, unspecified: Secondary | ICD-10-CM | POA: Insufficient documentation

## 2017-11-29 DIAGNOSIS — Z91040 Latex allergy status: Secondary | ICD-10-CM | POA: Diagnosis not present

## 2017-11-29 DIAGNOSIS — K047 Periapical abscess without sinus: Secondary | ICD-10-CM | POA: Insufficient documentation

## 2017-11-29 DIAGNOSIS — K0889 Other specified disorders of teeth and supporting structures: Secondary | ICD-10-CM

## 2017-11-29 MED ORDER — OXYCODONE-ACETAMINOPHEN 5-325 MG PO TABS
1.0000 | ORAL_TABLET | ORAL | Status: DC | PRN
  Administered 2017-11-29: 1 via ORAL
  Filled 2017-11-29: qty 1

## 2017-11-29 NOTE — ED Triage Notes (Signed)
Patient c/o dental pain (right lower) along with swelling that started yesterday.

## 2017-11-30 MED ORDER — HYDROCODONE-ACETAMINOPHEN 5-325 MG PO TABS: 1.0000 | ORAL_TABLET | ORAL | 0 refills | Status: DC | PRN

## 2017-11-30 MED ORDER — IBUPROFEN 400 MG PO TABS
600.0000 mg | ORAL_TABLET | Freq: Once | ORAL | Status: AC
  Administered 2017-11-30: 600 mg via ORAL
  Filled 2017-11-30: qty 1

## 2017-11-30 MED ORDER — IBUPROFEN 600 MG PO TABS: 600.0000 mg | ORAL_TABLET | Freq: Four times a day (QID) | ORAL | 0 refills | Status: AC | PRN

## 2017-11-30 MED ORDER — PENICILLIN V POTASSIUM 500 MG PO TABS: 500.0000 mg | ORAL_TABLET | Freq: Three times a day (TID) | ORAL | 0 refills | Status: DC

## 2017-11-30 NOTE — ED Provider Notes (Signed)
[facility] Provider Note   CSN: [id number] Arrival date & time: 11/29/17  2135     History   Chief Complaint Chief Complaint  Patient presents with  . Dental Pain    HPI Jennifer Hatfield is a 42 y.o. female.  Patient here for evaluation of progressive, severe, right sided dental/jaw pain that started yesterday. Pain progressed to where it is painful for her to open her mouth and there is pain, but not difficulty, swallowing. She has not eaten or had anything to drink today secondary to pain. No fever, facial swelling or known dental injury. She reports the pain radiates into the submental area.   The history is provided by the patient and the spouse.  Dental Pain      Past Medical History:  Diagnosis Date  . Allergy    latex    Patient Active Problem List   Diagnosis Date Noted  . Fibroid uterus 11/25/2012    No past surgical history on file.   OB History    Gravida  1   Para  1   Term  1   Preterm      AB      Living  1     SAB      TAB      Ectopic      Multiple      Live Births               Home Medications    Prior to Admission medications   Not on File    Family History Family History  Problem Relation Age of Onset  . Cancer Mother        breast cancer  . Cancer Maternal Aunt   . Cancer Paternal Aunt        breast    Social History Social History   Tobacco Use  . Smoking status: Never Smoker  Substance Use Topics  . Alcohol use: No  . Drug use: No     Allergies   Latex   Review of Systems Review of Systems  Constitutional: Negative for chills and fever.  HENT: Positive for dental problem, ear pain and trouble swallowing. Negative for facial swelling, mouth sores and sore throat.   Respiratory: Negative.  Negative for shortness of breath.   Gastrointestinal: Negative.  Negative for nausea and vomiting.  Skin: Negative.  Negative for color change.  Neurological:  Negative.  Negative for headaches.     Physical Exam Updated Vital Signs BP (!) 157/87   Pulse 86   Temp 98.8 F (37.1 C) (Oral)   Resp 18   Ht 5\' 4"  (1.626 m)   Wt 54.4 kg (120 lb)   LMP 11/26/2017 (Approximate)   SpO2 100%   BMI 20.60 kg/m   Physical Exam  Constitutional: She is oriented to person, place, and time. She appears well-developed and well-nourished.  HENT:  Generally good dentition. No visualized abscess. Oropharynx is benign. Tender to lower left molar ridge without redness or swelling.   Neck: Normal range of motion. Neck supple.  Mild tenderness without swelling, induration or redness to left submandibular and upper neck.   Pulmonary/Chest: Effort normal. No respiratory distress.  Lymphadenopathy:       Head (left side): No submental adenopathy present.  Neurological: She is alert and oriented to person, place, and time.  Skin: Skin is warm and dry.     ED Treatments / Results  Labs (all labs ordered  are listed, but only abnormal results are displayed) Labs Reviewed - No data to display  EKG None  Radiology Dg Orthopantogram  Result Date: 11/30/2017 CLINICAL DATA:  Right lower jaw pain EXAM: ORTHOPANTOGRAM/PANORAMIC COMPARISON:  None. FINDINGS: No mandibular fracture. No significant root lucencies are visualized. No focal osseous abnormality. IMPRESSION: Negative Electronically Signed   By: Donavan Foil M.D.   On: 11/30/2017 00:05    Procedures Procedures (including critical care time)  Medications Ordered in ED Medications  oxyCODONE-acetaminophen (PERCOCET/ROXICET) 5-325 MG per tablet 1 tablet (1 tablet Oral Given 11/29/17 2149)     Initial Impression / Assessment and Plan / ED Course  I have reviewed the triage vital signs and the nursing notes.  Pertinent labs & imaging results that were available during my care of the patient were reviewed by me and considered in my medical decision making (see chart for details).     Patient here for  complaint of dental pain. No facial swelling or fever. She reports difficulty swallowing because of pain in the left side of her mouth and throat.   DDx: dental abscess, vs deep space abscess vs Ludwigs angina vs non-bacterial dental pain  The patient was given pain medication on arrival and feels better but not 100% pain free. She is given a PO challenge and is able to drink without difficulty.  No fever, stiffness of neck, airway symptoms that would be typical for Ludwigs. No significant neck or submandibular tenderness or induration, and without fever - doubt localized abscess of this area. There is no visualized dental abscess on imaging.   Feel the patient is appropriate for discharge home but strict return precautions are discussed, including fever, airway compromise or SOB, facial or neck swelling, unable to swallow or throat swelling. Will start on PCN and encourage follow up with dentistry.  Final Clinical Impressions(s) / ED Diagnoses   Final diagnoses:  Dental abscess   1. Dental pain  ED Discharge Orders    None       Charlann Lange, PA-C 11/30/17 0031    Margette Fast, MD 11/30/17 [phone]

## 2017-11-30 NOTE — Discharge Instructions (Signed)
Follow up as soon as possible with your dentist to determine a dental cause of pain. If you have any fever, unable to swallow, shortness of breath, neck swelling or new symptom of concern, return to the emergency department immediately.

## 2019-12-15 ENCOUNTER — Other Ambulatory Visit: Payer: Self-pay | Admitting: Nurse Practitioner

## 2019-12-15 DIAGNOSIS — Z1231 Encounter for screening mammogram for malignant neoplasm of breast: Secondary | ICD-10-CM

## 2019-12-29 ENCOUNTER — Ambulatory Visit
Admission: RE | Admit: 2019-12-29 | Discharge: 2019-12-29 | Disposition: A | Discharge status: Home / Self Care | Payer: BC Managed Care – PPO | Source: Ambulatory Visit | Attending: Nurse Practitioner | Admitting: Nurse Practitioner

## 2019-12-29 ENCOUNTER — Other Ambulatory Visit: Payer: Self-pay

## 2019-12-29 DIAGNOSIS — Z1231 Encounter for screening mammogram for malignant neoplasm of breast: Secondary | ICD-10-CM

## 2021-09-29 ENCOUNTER — Other Ambulatory Visit: Payer: Self-pay | Admitting: Family Medicine

## 2021-09-29 DIAGNOSIS — Z1231 Encounter for screening mammogram for malignant neoplasm of breast: Secondary | ICD-10-CM

## 2021-09-30 ENCOUNTER — Ambulatory Visit
Admission: RE | Admit: 2021-09-30 | Discharge: 2021-09-30 | Disposition: A | Discharge status: Home / Self Care | Payer: Managed Care, Other (non HMO) | Source: Ambulatory Visit | Attending: Family Medicine | Admitting: Family Medicine

## 2021-09-30 DIAGNOSIS — Z1231 Encounter for screening mammogram for malignant neoplasm of breast: Secondary | ICD-10-CM

## 2023-02-24 IMAGING — MG MM DIGITAL SCREENING BILAT W/ TOMO AND CAD
8 series · 8 of 24 positions shown · non-contrast
Comparison: Previous exams.

CLINICAL DATA: Screening.

EXAM:
DIGITAL SCREENING BILATERAL MAMMOGRAM WITH TOMOSYNTHESIS AND CAD
TECHNIQUE: Bilateral screening digital craniocaudal and mediolateral oblique
mammograms were obtained. Bilateral screening digital breast
tomosynthesis was performed. The images were evaluated with
computer-aided detection.

[R MLO synth-2D]
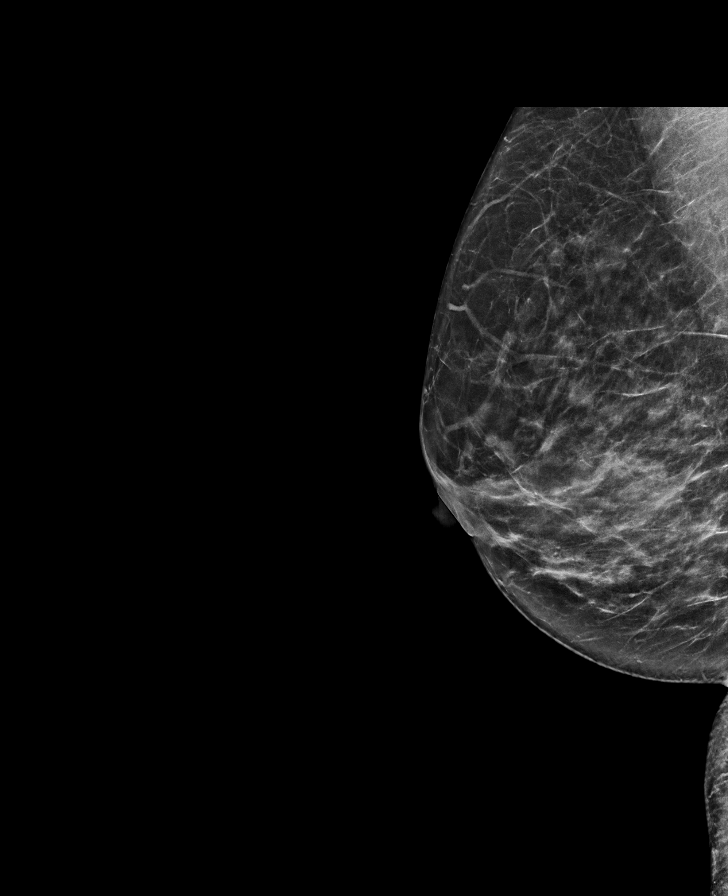

[L MLO synth-2D]
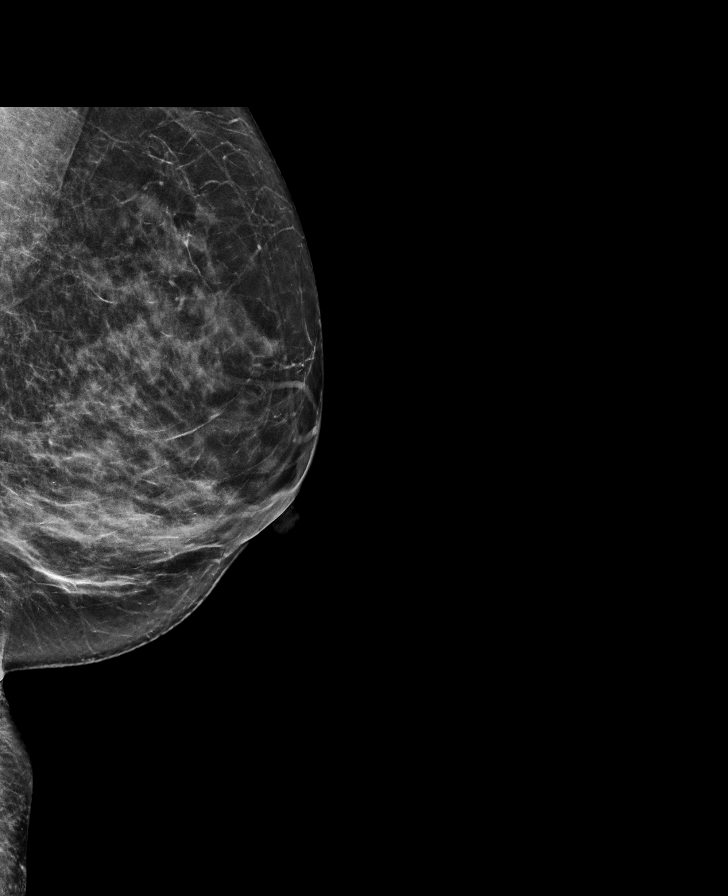

[L CC synth-2D]
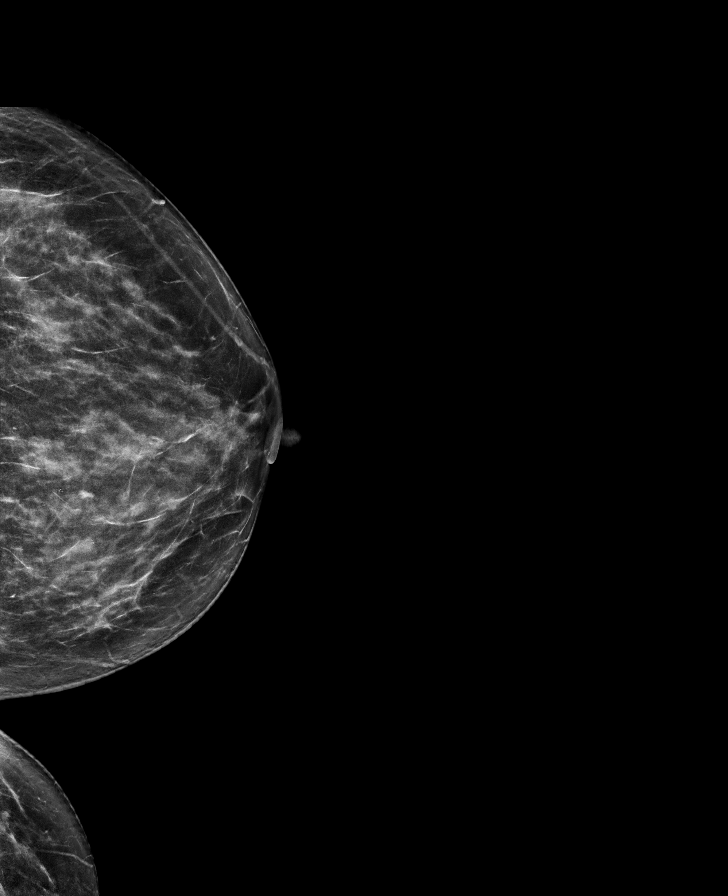

[R CC synth-2D]
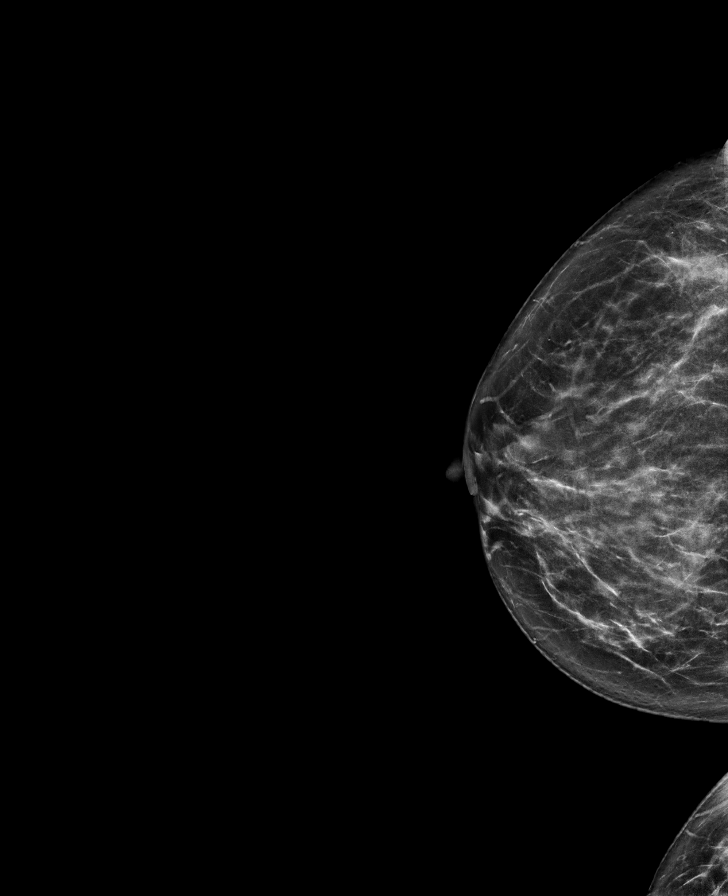

[L CC tomo · tomo slice 35/69.0]
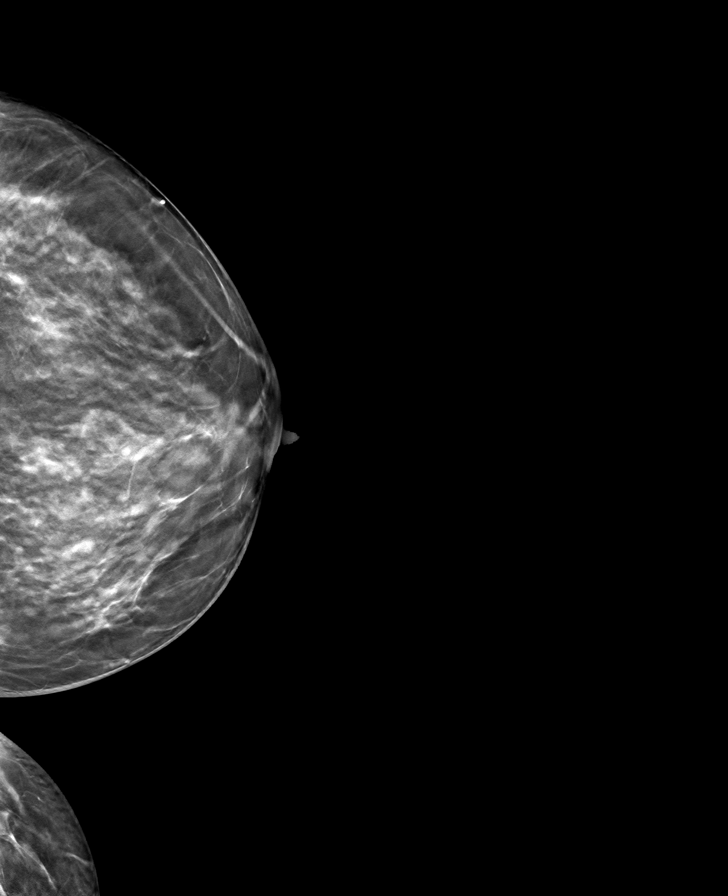

[R CC tomo · tomo slice 35/70.0]
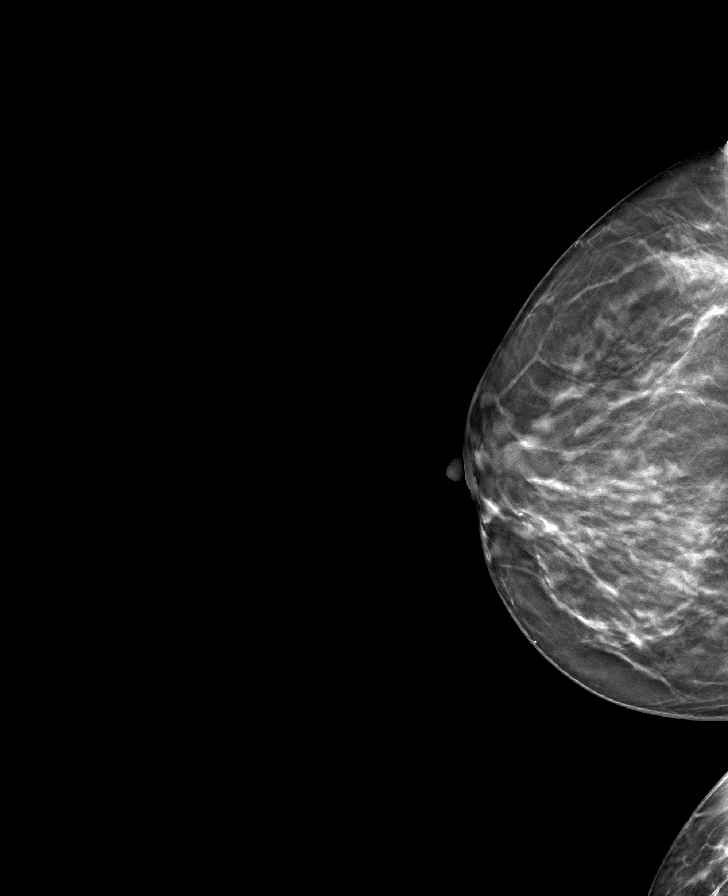

[R MLO tomo · tomo slice 34/67.0]
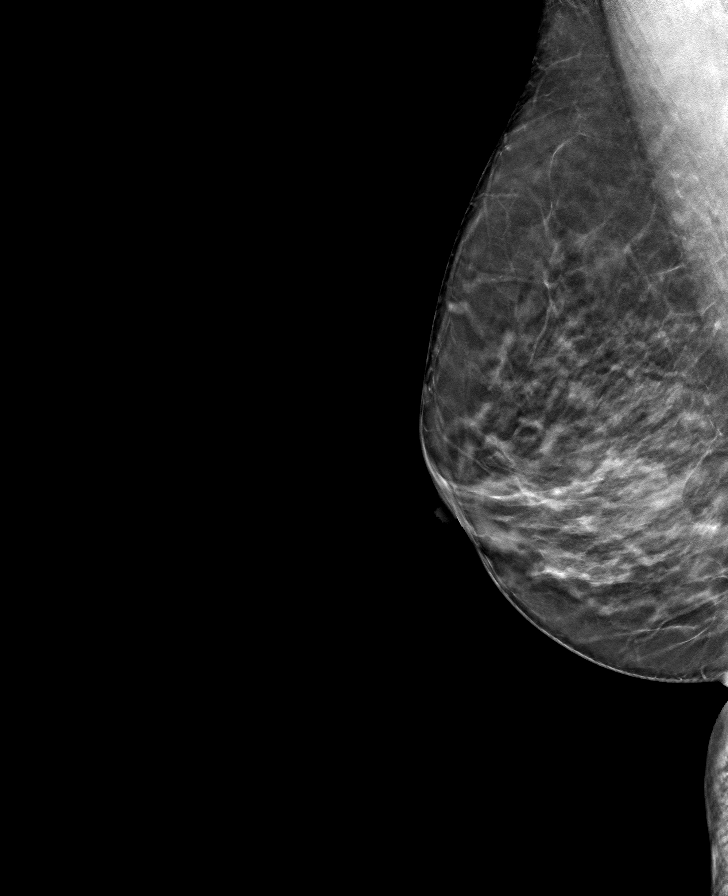

[L MLO tomo · tomo slice 33/66.0]
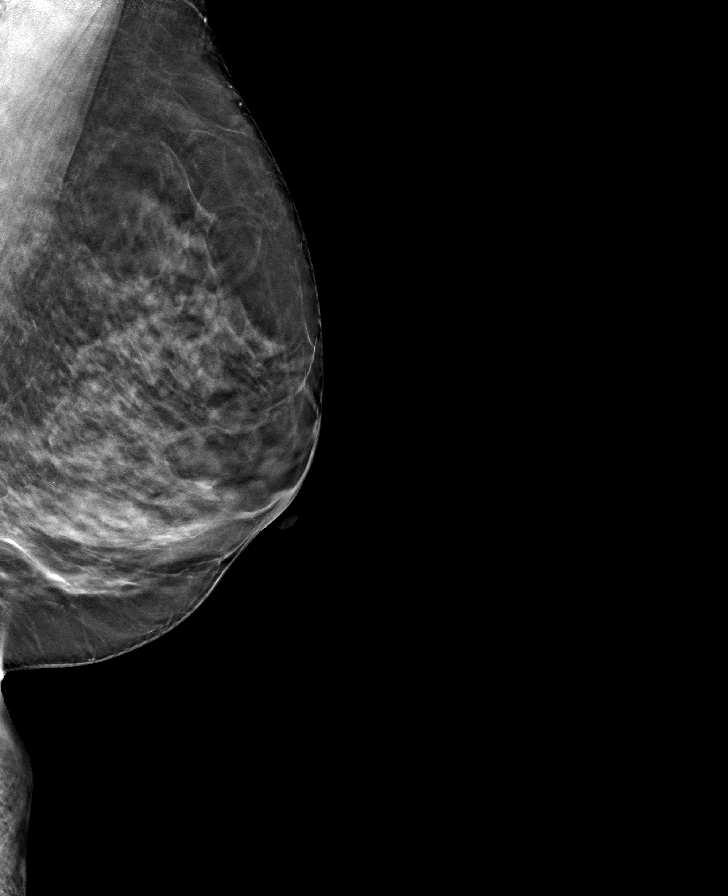

[8 of 24 positions shown; findings below may reference images not displayed]

ACR Breast Density Category c: The breast tissue is heterogeneously
dense, which may obscure small masses.
FINDINGS: There are no findings suspicious for malignancy.
IMPRESSION: No mammographic evidence of malignancy. A result letter of this
screening mammogram will be mailed directly to the patient.

RECOMMENDATION:
Screening mammogram in one year. (Code:P6-8-A84)

BI-RADS CATEGORY  1: Negative.

## 2023-07-26 ENCOUNTER — Emergency Department (HOSPITAL_COMMUNITY)
Admission: EM | Admit: 2023-07-26 | Discharge: 2023-07-27 | Disposition: A | Discharge status: Home / Self Care | Payer: Managed Care, Other (non HMO) | Attending: Emergency Medicine | Admitting: Emergency Medicine

## 2023-07-26 ENCOUNTER — Emergency Department (HOSPITAL_COMMUNITY): Payer: Managed Care, Other (non HMO)

## 2023-07-26 ENCOUNTER — Other Ambulatory Visit: Payer: Self-pay

## 2023-07-26 ENCOUNTER — Encounter (HOSPITAL_COMMUNITY): Payer: Self-pay | Admitting: Emergency Medicine

## 2023-07-26 DIAGNOSIS — Z79899 Other long term (current) drug therapy: Secondary | ICD-10-CM | POA: Diagnosis not present

## 2023-07-26 DIAGNOSIS — R509 Fever, unspecified: Secondary | ICD-10-CM

## 2023-07-26 DIAGNOSIS — F17210 Nicotine dependence, cigarettes, uncomplicated: Secondary | ICD-10-CM | POA: Diagnosis not present

## 2023-07-26 DIAGNOSIS — I10 Essential (primary) hypertension: Secondary | ICD-10-CM | POA: Diagnosis not present

## 2023-07-26 DIAGNOSIS — R079 Chest pain, unspecified: Secondary | ICD-10-CM | POA: Diagnosis present

## 2023-07-26 DIAGNOSIS — Z91040 Latex allergy status: Secondary | ICD-10-CM | POA: Insufficient documentation

## 2023-07-26 DIAGNOSIS — R0789 Other chest pain: Secondary | ICD-10-CM

## 2023-07-26 LAB — BASIC METABOLIC PANEL
Anion gap: 10 (ref 5–15)
BUN: 11 mg/dL (ref 6–20)
CO2: 23 mmol/L (ref 22–32)
Calcium: 8.9 mg/dL (ref 8.9–10.3)
Chloride: 102 mmol/L (ref 98–111)
Creatinine, Ser: 0.75 mg/dL (ref 0.44–1.00)
GFR, Estimated: >60 mL/min (ref >60)
Glucose, Bld: 114 mg/dL — ABNORMAL HIGH (ref 70–99)
Potassium: 3.3 mmol/L — ABNORMAL LOW (ref 3.5–5.1)
Sodium: 135 mmol/L (ref 135–145)

## 2023-07-26 LAB — RESP PANEL BY RT-PCR (RSV, FLU A&B, COVID)  RVPGX2
Influenza A by PCR: NEGATIVE
Influenza B by PCR: NEGATIVE
Resp Syncytial Virus by PCR: NEGATIVE
SARS Coronavirus 2 by RT PCR: NEGATIVE

## 2023-07-26 LAB — CBC
HCT: 36.6 % (ref 36.0–46.0)
Hemoglobin: 12.1 g/dL (ref 12.0–15.0)
MCH: 28.5 pg (ref 26.0–34.0)
MCHC: 33.1 g/dL (ref 30.0–36.0)
MCV: 86.3 fL (ref 80.0–100.0)
Platelets: 291 10*3/uL (ref 150–400)
RBC: 4.24 MIL/uL (ref 3.87–5.11)
RDW: 13.4 % (ref 11.5–15.5)
WBC: 6.8 10*3/uL (ref 4.0–10.5)
nRBC: 0 % (ref 0.0–0.2)

## 2023-07-26 LAB — TROPONIN I (HIGH SENSITIVITY)
Troponin I (High Sensitivity): <2 ng/L (ref <18)
Troponin I (High Sensitivity): <2 ng/L (ref <18)

## 2023-07-26 NOTE — ED Triage Notes (Signed)
 BIB EMS from work at [employer].  Pt had off and on aching Sunday, today had CP this afternoon while at work.  BP 200s systolic.  Not taking BP meds x 1 year.  Febrile with EMS at 101.6.  Given 3 baby aspirin and 2 nitroglycerin.  CP free at this time.  20G RH

## 2023-07-27 MED ORDER — AMLODIPINE BESYLATE 10 MG PO TABS: 10.0000 mg | ORAL_TABLET | Freq: Every day | ORAL | 2 refills | Status: DC

## 2023-07-27 NOTE — ED Notes (Signed)
 ED Provider at bedside.

## 2023-07-27 NOTE — ED Provider Notes (Signed)
 [place] EMERGENCY DEPARTMENT AT [facility] Provider Note   CSN: [id number] Arrival date & time: 07/26/23  1710     History  Chief Complaint  Patient presents with   Chest Pain   Fever    Jennifer Hatfield is a 48 y.o. female.  Patient preents to the ED for chest pain primarily. States she was at work and had onset of mid retrosternal chest pain and left lateral chest pain. No radiation. No associated sob, diaphoresis, nausea or lightheadedness. Lasted about 30 minutes. Associated with BP of >200 systolic. Supposed to be on antihypertensives but 2/2 insurance changes doesn't have a PCP or Rx at this time. No le edema. Did get NTG and ASA with EMS prior to arrival. On arrival here had a fever. Has had a dry cough recently and felt a little rundown Sunday night but no other infectious symptoms like rhinorrhea, sinus pain, urinary symptoms, Gi symptoms. Fever improved without treatment.    Chest Pain Associated symptoms: fever   Fever Associated symptoms: chest pain        Home Medications Prior to Admission medications   Medication Sig Start Date End Date Taking? Authorizing Provider  amLODipine (NORVASC) 10 MG tablet Take 1 tablet (10 mg total) by mouth daily. 07/27/23  Yes Teodora Baumgarten, Barbara Cower, MD  ibuprofen (ADVIL,MOTRIN) 600 MG tablet Take 1 tablet (600 mg total) by mouth every 6 (six) hours as needed. 11/30/17   Elpidio Anis, PA-C      Allergies    Latex    Review of Systems   Review of Systems  Constitutional:  Positive for fever.  Cardiovascular:  Positive for chest pain.    Physical Exam Updated Vital Signs BP (!) 156/90   Pulse 93   Temp 98.9 F (37.2 C) (Oral)   Resp 16   SpO2 99%  Physical Exam Vitals and nursing note reviewed.  Constitutional:      Appearance: She is well-developed.  HENT:     Head: Normocephalic and atraumatic.  Cardiovascular:     Rate and Rhythm: Normal rate and regular rhythm.  Pulmonary:     Effort: No  respiratory distress.     Breath sounds: No stridor.  Abdominal:     General: There is no distension.     Palpations: Abdomen is soft.  Musculoskeletal:        General: Normal range of motion.     Cervical back: Normal range of motion.     Right lower leg: No edema.     Left lower leg: No edema.  Neurological:     Mental Status: She is alert.     ED Results / Procedures / Treatments   Labs (all labs ordered are listed, but only abnormal results are displayed) Labs Reviewed  BASIC METABOLIC PANEL - Abnormal; Notable for the following components:      Result Value   Potassium 3.3 (*)    Glucose, Bld 114 (*)    All other components within normal limits  RESP PANEL BY RT-PCR (RSV, FLU A&B, COVID)  RVPGX2  CBC  POC URINE PREG, ED  TROPONIN I (HIGH SENSITIVITY)  TROPONIN I (HIGH SENSITIVITY)    EKG None  Radiology DG Chest 2 View Result Date: 07/26/2023 CLINICAL DATA:  Intermittent chest pain since Sunday, hypertension EXAM: CHEST - 2 VIEW COMPARISON:  06/28/2009 FINDINGS: Frontal and lateral views of the chest demonstrate an enlarged cardiac silhouette. No acute airspace disease, effusion, or pneumothorax. No acute bony abnormalities. IMPRESSION: 1.  Enlarged cardiac silhouette. 2. No acute airspace disease. Electronically Signed   By: Sharlet Salina M.D.   On: 07/26/2023 19:41    Procedures Procedures    Medications Ordered in ED Medications - No data to display  ED Course/ Medical Decision Making/ A&P                                 Medical Decision Making Amount and/or Complexity of Data Reviewed Labs: ordered. Radiology: ordered.  Risk Prescription drug management.   No red flags for HTN emergency requiring further workup. With her age, not seeing a doctor, hypertension will start norvasc and have her obtain PCP but also fu w/ cardiology for possible echo 2/2 cardiomegaly seen  on CXR. Asymptoamtic for multiple hours here, low risk for PE. Doubt ACS at this  time. Likely related to BP.  Fever without obvious cause. Cxr ok. Covid/flu/rsv negative. May have been a fluke as she didn't feel unwell at that time? Will continue to monitor symptoms for same but no indication for further workup or treatment on this front at this time.   Final Clinical Impression(s) / ED Diagnoses Final diagnoses:  Fever, unspecified fever cause  Atypical chest pain  Hypertension, unspecified type    Rx / DC Orders ED Discharge Orders          Ordered    amLODipine (NORVASC) 10 MG tablet  Daily        07/27/23 0203    Ambulatory referral to Cardiology        07/27/23 0203              Josten Warmuth, Barbara Cower, MD 07/27/23 [phone]

## 2024-01-17 NOTE — Progress Notes (Signed)
 Cardiology [facility] New Patient Note:    Date:  01/27/2024   ID:  Jennifer, Hatfield 15-Oct-1975, MRN [id number]  PCP:  Patient, No Pcp Per   [facility] Providers Cardiologist:  Jennifer JINNY Lawrence, MD Cardiology APP:  Jennifer Jon Garre, PA     Referring MD: Jennifer Mayo, MD   Chief Complaint  Patient presents with   New Patient (Initial Visit)    HTN, CP, lower extremity swelling, DOE    History of Present Illness:    Jennifer Hatfield is a 48 y.o. female with a hx of HTN. She presented to [facility] 07/27/23 with chest pain and hypertension. Hypertensive urgency with SBP > 200 in the setting of insurance change and lack of PCP to refill anti-hypertensive medications. She was febrile felt related to viral illness. CXR with cardiomegaly. CE x 2 negative. She was discharged without admission and advised to follow with PCP and cardiology. Discharged with amlodipine .  She presents to establish with cardiology.   She recounts that she stopped taking BP medications preceding CP prompting ER evaluation. Since Feb, no further chest pain since resuming amlodipine . She does not check BP at home, but has a cuff at home. She is now compliant with medications. She has noted increased LE swelling with the increase in amlodipine  to 10 mg.   She tries to stay active and is on her feet all day, but gets winded going up stairs. We discussed a walking program.  Family history: father has HTN, CAD. Mother is a 3 time breast cancer survivor. Sister is healthy. Son is 41 yo and is in community college to be a Curator.  She is a never smoker, no alcohol, no illicit drugs.  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 She lives in [place] and lives at home with her husband. She sees PCP regularly.    Past Medical History:  Diagnosis Date   Allergy    latex   Chest pain    HTN (hypertension)     History reviewed. No pertinent surgical history.  Current  Medications: Current Meds  Medication Sig   ibuprofen  (ADVIL ,MOTRIN ) 600 MG tablet Take 1 tablet (600 mg total) by mouth every 6 (six) hours as needed.   olmesartan  (BENICAR ) 40 MG tablet Take 1 tablet (40 mg total) by mouth daily.   [DISCONTINUED] amLODipine  (NORVASC ) 10 MG tablet Take 1 tablet (10 mg total) by mouth daily.     Allergies:   Latex   Social History   Socioeconomic History   Marital status: Married    Spouse name: Not on file   Number of children: Not on file   Years of education: Not on file   Highest education level: Not on file  Occupational History   Not on file  Tobacco Use   Smoking status: Never   Smokeless tobacco: Not on file  Substance and Sexual Activity   Alcohol use: No   Drug use: No   Sexual activity: Yes    Birth control/protection: None  Other Topics Concern   Not on file  Social History Narrative   Not on file   Social Drivers of Health   Financial Resource Strain: Not on file  Food Insecurity: Not on file  Transportation Needs: Not on file  Physical Activity: Not on file  Stress: Not on file  Social Connections: Not on file     Family History: The patient's family history includes Breast cancer in  her mother; Cancer in her maternal aunt, mother, and paternal aunt.  ROS:   Please see the history of present illness.     All other systems reviewed and are negative.  EKGs/Labs/Other Studies Reviewed:    The following studies were reviewed today:  Echo pending  EKG Interpretation Date/Time:  Thursday January 27 2024 08:34:34 EDT Ventricular Rate:  74 PR Interval:  168 QRS Duration:  90 QT Interval:  382 QTC Calculation: 424 R Axis:   1  Text Interpretation: Normal sinus rhythm Normal ECG When compared with ECG of 27-Jun-2009 13:09, Nonspecific T wave abnormality now evident in Anterior leads Confirmed by Jennifer Hatfield ([id number]) on 01/27/2024 8:36:22 AM    Recent Labs: 07/26/2023: BUN 11; Creatinine, Ser 0.75; Hemoglobin 12.1;  Platelets 291; Potassium 3.3; Sodium 135  Recent Lipid Panel No results found for: CHOL, TRIG, HDL, CHOLHDL, VLDL, LDLCALC, LDLDIRECT   Risk Assessment/Calculations:                Physical Exam:    VS:  BP 124/80   Pulse 88   Ht 5' 4 (1.626 m)   Wt 171 lb (77.6 kg)   SpO2 98%   BMI 29.35 kg/m     Wt Readings from Last 3 Encounters:  01/27/24 171 lb (77.6 kg)  11/29/17 120 lb (54.4 kg)  11/25/12 137 lb 12.8 oz (62.5 kg)     GEN:  Well nourished, well developed in no acute distress HEENT: Normal NECK: No JVD; No carotid bruits LYMPHATICS: No lymphadenopathy CARDIAC: RRR, no murmurs, rubs, gallops RESPIRATORY:  Clear to auscultation without rales, wheezing or rhonchi  ABDOMEN: Soft, non-tender, non-distended MUSCULOSKELETAL:  No edema; No deformity  SKIN: Warm and dry NEUROLOGIC:  Alert and oriented x 3 PSYCHIATRIC:  Normal affect   ASSESSMENT:    1. DOE (dyspnea on exertion)   2. Swelling of lower extremity    PLAN:    In order of problems listed above:  Chest pain - likely related to HTN urgency - no further chest pain since taking amlodipine  - will continue to monitor   Cardiomegaly on CXR DOE Lower extremity swelling - echo pending - suspect LE edema related to amlodipine    Hypertension Hypertensive urgency - on 10 mg amlodipine  - she is having some LE swelling and wishes to switch medications - will prescribe 40 mg olmesartan  - she will take half tablet and check her BP at work - will check a BMP in [redacted] week along with lipid panel and A1c   Hyperlipidemia with LDL goal < 100 In May 2025: Total cholesterol: 229 HDL: 71 LDL: 151 Triglycerides 48 I do not see a recent A1c  Will plan to check a BMP, A1c, and fasting lipid in approximately 1 week after starting olmesartan .    She will follow-up with me after echocardiogram.              Medication Adjustments/Labs and Tests Ordered: Current medicines are reviewed at  length with the patient today.  Concerns regarding medicines are outlined above.  Orders Placed This Encounter  Procedures   Basic Metabolic Panel (BMET)   HgB A1c   Lipid Profile   EKG 12-Lead   ECHOCARDIOGRAM COMPLETE   Meds ordered this encounter  Medications   olmesartan  (BENICAR ) 40 MG tablet    Sig: Take 1 tablet (40 mg total) by mouth daily.    Dispense:  90 tablet    Refill:  3    Patient Instructions  Medication Instructions:  Your physician has recommended you make the following change in your medication:   STOP Amlodipine   START Olmesartan  40 mg taking 1/2 tablet daily  *If you need a refill on your cardiac medications before your next appointment, please call your pharmacy*  Lab Work: COME BACK FASTING TO [facility] FOR:  LIPID, BMET, & HGBA1C    If you have labs (blood work) drawn today and your tests are completely normal, you will receive your results only by: MyChart Message (if you have MyChart) OR A paper copy in the mail If you have any lab test that is abnormal or we need to change your treatment, we will call you to review the results.  Testing/Procedures: Your physician has requested that you have an echocardiogram. Echocardiography is a painless test that uses sound waves to create images of your heart. It provides your doctor with information about the size and shape of your heart and how well your heart's chambers and valves are working. This procedure takes approximately one hour. There are no restrictions for this procedure. Please do NOT wear cologne, perfume, aftershave, or lotions (deodorant is allowed). Please arrive 15 minutes prior to your appointment time.  Please note: We ask at that you not bring children with you during ultrasound (echo/ vascular) testing. Due to room size and safety concerns, children are not allowed in the ultrasound rooms during exams. Our front office staff cannot provide observation of children in our lobby area while  testing is being conducted. An adult accompanying a patient to their appointment will only be allowed in the ultrasound room at the discretion of the ultrasound technician under special circumstances. We apologize for any inconvenience.   Follow-Up: At [facility], you and your health needs are our priority.  As part of our continuing mission to provide you with exceptional heart care, our providers are all part of one team.  This team includes your primary Cardiologist (physician) and Advanced Practice Providers or APPs (Physician Assistants and Nurse Practitioners) who all work together to provide you with the care you need, when you need it.  Your next appointment:   1 month(s)  Provider:   Jon Hails, PA-C          We recommend signing up for the patient portal called MyChart.  Sign up information is provided on this After Visit Summary.  MyChart is used to connect with patients for Virtual Visits (Telemedicine).  Patients are able to view lab/test results, encounter notes, upcoming appointments, etc.  Non-urgent messages can be sent to your provider as well.   To learn more about what you can do with MyChart, go to [email or web address].   Other Instructions        Signed, Jon Nat Hails, [place]  01/27/2024 8:39 AM    [facility]

## 2024-01-18 ENCOUNTER — Ambulatory Visit: Admitting: Cardiology

## 2024-01-27 ENCOUNTER — Encounter: Payer: Self-pay | Admitting: Physician Assistant

## 2024-01-27 ENCOUNTER — Other Ambulatory Visit: Payer: Self-pay | Admitting: *Deleted

## 2024-01-27 ENCOUNTER — Ambulatory Visit: Attending: Cardiology | Admitting: Physician Assistant

## 2024-01-27 VITALS — BP 124/80 | HR 88 | Ht 64.0 in | Wt 171.0 lb

## 2024-01-27 DIAGNOSIS — M7989 Other specified soft tissue disorders: Secondary | ICD-10-CM

## 2024-01-27 DIAGNOSIS — R0609 Other forms of dyspnea: Secondary | ICD-10-CM

## 2024-01-27 MED ORDER — OLMESARTAN MEDOXOMIL 40 MG PO TABS: 40.0000 mg | ORAL_TABLET | Freq: Every day | ORAL | 3 refills | Status: AC

## 2024-01-27 NOTE — Patient Instructions (Signed)
 Medication Instructions:  Your physician has recommended you make the following change in your medication:   STOP Amlodipine   START Olmesartan  40 mg taking 1/2 tablet daily  *If you need a refill on your cardiac medications before your next appointment, please call your pharmacy*  Lab Work: COME BACK FASTING TO [facility] FOR:  LIPID, BMET, & HGBA1C    If you have labs (blood work) drawn today and your tests are completely normal, you will receive your results only by: MyChart Message (if you have MyChart) OR A paper copy in the mail If you have any lab test that is abnormal or we need to change your treatment, we will call you to review the results.  Testing/Procedures: Your physician has requested that you have an echocardiogram. Echocardiography is a painless test that uses sound waves to create images of your heart. It provides your doctor with information about the size and shape of your heart and how well your heart's chambers and valves are working. This procedure takes approximately one hour. There are no restrictions for this procedure. Please do NOT wear cologne, perfume, aftershave, or lotions (deodorant is allowed). Please arrive 15 minutes prior to your appointment time.  Please note: We ask at that you not bring children with you during ultrasound (echo/ vascular) testing. Due to room size and safety concerns, children are not allowed in the ultrasound rooms during exams. Our front office staff cannot provide observation of children in our lobby area while testing is being conducted. An adult accompanying a patient to their appointment will only be allowed in the ultrasound room at the discretion of the ultrasound technician under special circumstances. We apologize for any inconvenience.   Follow-Up: At [facility], you and your health needs are our priority.  As part of our continuing mission to provide you with exceptional heart care, our providers are all part of one  team.  This team includes your primary Cardiologist (physician) and Advanced Practice Providers or APPs (Physician Assistants and Nurse Practitioners) who all work together to provide you with the care you need, when you need it.  Your next appointment:   1 month(s)  Provider:   Jon Hails, PA-C          We recommend signing up for the patient portal called MyChart.  Sign up information is provided on this After Visit Summary.  MyChart is used to connect with patients for Virtual Visits (Telemedicine).  Patients are able to view lab/test results, encounter notes, upcoming appointments, etc.  Non-urgent messages can be sent to your provider as well.   To learn more about what you can do with MyChart, go to [email or web address].   Other Instructions

## 2024-02-25 ENCOUNTER — Ambulatory Visit (HOSPITAL_COMMUNITY)

## 2024-03-06 ENCOUNTER — Ambulatory Visit: Admitting: Physician Assistant

## 2024-04-06 NOTE — Progress Notes (Deleted)
 Cardiology Office Note:    Date:  04/06/2024   ID:  Jennifer Hatfield, DOB 01/17/1976, MRN [id number]  PCP:  Patient, No Pcp Per   [facility] Providers Cardiologist:  Newman JINNY Lawrence, MD Cardiology APP:  Madie Jon Garre, PA { Click to update primary MD,subspecialty MD or APP then REFRESH:1}    Referring MD: No ref. provider found   No chief complaint on file. ***  History of Present Illness:    Jennifer Hatfield is a 48 y.o. female with a hx of HTN. She presented to [facility] 07/27/23 with chest pain and hypertension. Hypertensive urgency with SBP > 200 in the setting of insurance change and lack of PCP to refill anti-hypertensive medications. She was febrile felt related to viral illness. CXR with cardiomegaly. CE x 2 negative. She was discharged without admission and advised to follow with PCP and cardiology. Discharged with amlodipine .  [facility] to establish cardiology care. I opted to obtain an echo, which demonstrated   Chest pain in the ER felt related to hypertensive urgency. No further chest pain on anti-hypertensive medications. She had LE edema with 10 mg amlodipine  so I switched her to 40 mg olmesrtan.       Chest pain - felt related to hypertensive urgency - echocardiogram    Cardiomegaly on CXR - echo   Hypertension Hypertensive urgency - didn't tolerate 10 mg amlodipine  - Now on 40 mg olmesartan -needs an updated BMP     Past Medical History:  Diagnosis Date   Allergy    latex   Chest pain    HTN (hypertension)     No past surgical history on file.  Current Medications: No outpatient medications have been marked as taking for the 04/12/24 encounter (Appointment) with Madie Jon Garre, PA.     Allergies:   Latex   Social History   Socioeconomic History   Marital status: Married    Spouse name: Not on file   Number of children: Not on file   Years of education: Not on file   Highest  education level: Not on file  Occupational History   Not on file  Tobacco Use   Smoking status: Never   Smokeless tobacco: Not on file  Substance and Sexual Activity   Alcohol use: No   Drug use: No   Sexual activity: Yes    Birth control/protection: None  Other Topics Concern   Not on file  Social History Narrative   Not on file   Social Drivers of Health   Financial Resource Strain: Not on file  Food Insecurity: Not on file  Transportation Needs: Not on file  Physical Activity: Not on file  Stress: Not on file  Social Connections: Not on file     Family History: The patient's ***family history includes Breast cancer in her mother; Cancer in her maternal aunt, mother, and paternal aunt.  ROS:   Please see the history of present illness.    *** All other systems reviewed and are negative.  EKGs/Labs/Other Studies Reviewed:    The following studies were reviewed today: ***      Recent Labs: 07/26/2023: BUN 11; Creatinine, Ser 0.75; Hemoglobin 12.1; Platelets 291; Potassium 3.3; Sodium 135  Recent Lipid Panel No results found for: CHOL, TRIG, HDL, CHOLHDL, VLDL, LDLCALC, LDLDIRECT   Risk Assessment/Calculations:   {Does this patient have ATRIAL FIBRILLATION?[phone]}  No BP recorded.  {Refresh Note OR Click here to enter BP  :1}***  Physical Exam:    VS:  There were no vitals taken for this visit.    Wt Readings from Last 3 Encounters:  01/27/24 171 lb (77.6 kg)  11/29/17 120 lb (54.4 kg)  11/25/12 137 lb 12.8 oz (62.5 kg)     GEN: *** Well nourished, well developed in no acute distress HEENT: Normal NECK: No JVD; No carotid bruits LYMPHATICS: No lymphadenopathy CARDIAC: ***RRR, no murmurs, rubs, gallops RESPIRATORY:  Clear to auscultation without rales, wheezing or rhonchi  ABDOMEN: Soft, non-tender, non-distended MUSCULOSKELETAL:  No edema; No deformity  SKIN: Warm and dry NEUROLOGIC:  Alert and oriented x 3 PSYCHIATRIC:   Normal affect   ASSESSMENT:    No diagnosis found. PLAN:    In order of problems listed above:  ***      {Are you ordering a CV Procedure (e.g. stress test, cath, DCCV, TEE, etc)?   Press F2        :[id number]}    Medication Adjustments/Labs and Tests Ordered: Current medicines are reviewed at length with the patient today.  Concerns regarding medicines are outlined above.  No orders of the defined types were placed in this encounter.  No orders of the defined types were placed in this encounter.   There are no Patient Instructions on file for this visit.   Signed, Jon Nat Hails, [place]  04/06/2024 7:57 AM    [facility]

## 2024-04-07 ENCOUNTER — Ambulatory Visit (HOSPITAL_COMMUNITY)

## 2024-04-12 ENCOUNTER — Ambulatory Visit: Attending: Student in an Organized Health Care Education/Training Program | Admitting: Physician Assistant

## 2024-05-03 ENCOUNTER — Other Ambulatory Visit: Payer: Self-pay | Admitting: Family Medicine

## 2024-05-03 DIAGNOSIS — Z1231 Encounter for screening mammogram for malignant neoplasm of breast: Secondary | ICD-10-CM

## 2024-05-04 ENCOUNTER — Telehealth

## 2024-05-04 ENCOUNTER — Encounter: Payer: Self-pay | Admitting: Physician Assistant

## 2024-05-04 DIAGNOSIS — I10 Essential (primary) hypertension: Secondary | ICD-10-CM

## 2024-05-04 DIAGNOSIS — E7800 Pure hypercholesterolemia, unspecified: Secondary | ICD-10-CM | POA: Insufficient documentation

## 2024-05-04 NOTE — Patient Instructions (Signed)
 Jennifer Hatfield, thank you for joining Elsie Velma Lunger, PA-C for today's virtual visit.  While this provider is not your primary care provider (PCP), if your PCP is located in our provider database this encounter information will be shared with them immediately following your visit.   A [facility] MyChart account gives you access to today's visit and all your visits, tests, and labs performed at [facility]  click here if you don't have a [facility] MyChart account or go to [email or web address]  Consent: (Patient) Jennifer Hatfield provided verbal consent for this virtual visit at the beginning of the encounter.  Current Medications:  Current Outpatient Medications:    ibuprofen  (ADVIL ,MOTRIN ) 600 MG tablet, Take 1 tablet (600 mg total) by mouth every 6 (six) hours as needed., Disp: 30 tablet, Rfl: 0   olmesartan  (BENICAR ) 40 MG tablet, Take 1 tablet (40 mg total) by mouth daily., Disp: 90 tablet, Rfl: 3   Medications ordered in this encounter:  No orders of the defined types were placed in this encounter.    *If you need refills on other medications prior to your next appointment, please contact your pharmacy*  Follow-Up: Call back or seek an in-person evaluation if the symptoms worsen or if the condition fails to improve as anticipated.  [facility] [phone]  Other Instructions Make sure to stay well-hydrated and try to get 6 to 8 hours of sleep per night. Please follow the dietary recommendations below. Make sure to call your cardiology office first thing in the morning so that they can get your echocardiogram rescheduled. I am glad you are feeling fine right now and your blood pressure check today was at an acceptable range, but we need more than 1 isolated blood pressure measurement to determine if you are okay to forego blood pressure medications. As such, I want you to continue checking your blood pressure twice daily over  the next week, recording results, and following up with your cardiologist to determine further need for blood pressure medication. If you do have any issue with chest pain, shortness of breath, lightheadedness or dizziness, you need to seek an in person evaluation ASAP.  DASH Eating Plan DASH stands for Dietary Approaches to Stop Hypertension. The DASH eating plan is a healthy eating plan that has been shown to: Lower high blood pressure (hypertension). Reduce your risk for type 2 diabetes, heart disease, and stroke. Help with weight loss. What are tips for following this plan? Reading food labels Check food labels for the amount of salt (sodium) per serving. Choose foods with less than 5 percent of the Daily Value (DV) of sodium. In general, foods with less than 300 milligrams (mg) of sodium per serving fit into this eating plan. To find whole grains, look for the word whole as the first word in the ingredient list. Shopping Buy products labeled as low-sodium or no salt added. Buy fresh foods. Avoid canned foods and pre-made or frozen meals. Cooking Try not to add salt when you cook. Use salt-free seasonings or herbs instead of table salt or sea salt. Check with your health care provider or pharmacist before using salt substitutes. Do not fry foods. Cook foods in healthy ways, such as baking, boiling, grilling, roasting, or broiling. Cook using oils that are good for your heart. These include olive, canola, avocado, soybean, and sunflower oil. Meal planning  Eat a balanced diet. This should include: 4 or more servings of fruits and 4 or more servings of  vegetables each day. Try to fill half of your plate with fruits and vegetables. 6-8 servings of whole grains each day. 6 or less servings of lean meat, poultry, or fish each day. 1 oz is 1 serving. A 3 oz (85 g) serving of meat is about the same size as the palm of your hand. One egg is 1 oz (28 g). 2-3 servings of low-fat dairy each  day. One serving is 1 cup (237 mL). 1 serving of nuts, seeds, or beans 5 times each week. 2-3 servings of heart-healthy fats. Healthy fats called omega-3 fatty acids are found in foods such as walnuts, flaxseeds, fortified milks, and eggs. These fats are also found in cold-water fish, such as sardines, salmon, and mackerel. Limit how much you eat of: Canned or prepackaged foods. Food that is high in trans fat, such as fried foods. Food that is high in saturated fat, such as fatty meat. Desserts and other sweets, sugary drinks, and other foods with added sugar. Full-fat dairy products. Do not salt foods before eating. Do not eat more than 4 egg yolks a week. Try to eat at least 2 vegetarian meals a week. Eat more home-cooked food and less restaurant, buffet, and fast food. Lifestyle When eating at a restaurant, ask if your food can be made with less salt or no salt. If you drink alcohol: Limit how much you have to: 0-1 drink a day if you are female. 0-2 drinks a day if you are female. Know how much alcohol is in your drink. In the [place] one drink is one 12 oz bottle of beer (355 mL), one 5 oz glass of wine (148 mL), or one 1 oz glass of hard liquor (44 mL). General information Avoid eating more than 2,300 mg of salt a day. If you have hypertension, you may need to reduce your sodium intake to 1,500 mg a day. Work with your provider to stay at a healthy body weight or lose weight. Ask what the best weight range is for you. On most days of the week, get at least 30 minutes of exercise that causes your heart to beat faster. This may include walking, swimming, or biking. Work with your provider or dietitian to adjust your eating plan to meet your specific calorie needs. What foods should I eat? Fruits All fresh, dried, or frozen fruit. Canned fruits that are in their natural juice and do not have sugar added to them. Vegetables Fresh or frozen vegetables that are raw, steamed, roasted, or  grilled. Low-sodium or reduced-sodium tomato and vegetable juice. Low-sodium or reduced-sodium tomato sauce and tomato paste. Low-sodium or reduced-sodium canned vegetables. Grains Whole-grain or whole-wheat bread. Whole-grain or whole-wheat pasta. Brown rice. [place]. Bulgur. Whole-grain and low-sodium cereals. Pita bread. Low-fat, low-sodium crackers. Whole-wheat flour tortillas. Meats and other proteins Skinless chicken or turkey. Ground chicken or turkey. Pork with fat trimmed off. Fish and seafood. Egg whites. Dried beans, peas, or lentils. Unsalted nuts, nut butters, and seeds. Unsalted canned beans. Lean cuts of beef with fat trimmed off. Low-sodium, lean precooked or cured meat, such as sausages or meat loaves. Dairy Low-fat (1%) or fat-free (skim) milk. Reduced-fat, low-fat, or fat-free cheeses. Nonfat, low-sodium ricotta or cottage cheese. Low-fat or nonfat yogurt. Low-fat, low-sodium cheese. Fats and oils Soft margarine without trans fats. Vegetable oil. Reduced-fat, low-fat, or light mayonnaise and salad dressings (reduced-sodium). Canola, safflower, olive, avocado, soybean, and sunflower oils. Avocado. Seasonings and condiments Herbs. Spices. Seasoning mixes without salt. Other foods Unsalted  popcorn and pretzels. Fat-free sweets. The items listed above may not be all the foods and drinks you can have. Talk to a dietitian to learn more. What foods should I avoid? Fruits Canned fruit in a light or heavy syrup. Fried fruit. Fruit in cream or butter sauce. Vegetables Creamed or fried vegetables. Vegetables in a cheese sauce. Regular canned vegetables that are not marked as low-sodium or reduced-sodium. Regular canned tomato sauce and paste that are not marked as low-sodium or reduced-sodium. Regular tomato and vegetable juices that are not marked as low-sodium or reduced-sodium. Dene. Olives. Grains Baked goods made with fat, such as croissants, muffins, or some breads. Dry  pasta or rice meal packs. Meats and other proteins Fatty cuts of meat. Ribs. Fried meat. [place]. Bologna, salami, and other precooked or cured meats, such as sausages or meat loaves, that are not lean and low in sodium. Fat from the back of a pig (fatback). Bratwurst. Salted nuts and seeds. Canned beans with added salt. Canned or smoked fish. Whole eggs or egg yolks. Chicken or turkey with skin. Dairy Whole or 2% milk, cream, and half-and-half. Whole or full-fat cream cheese. Whole-fat or sweetened yogurt. Full-fat cheese. Nondairy creamers. Whipped toppings. Processed cheese and cheese spreads. Fats and oils Butter. Stick margarine. Lard. Shortening. Ghee. Bacon fat. Tropical oils, such as coconut, palm kernel, or palm oil. Seasonings and condiments Onion salt, garlic salt, seasoned salt, table salt, and sea salt. Worcestershire sauce. Tartar sauce. Barbecue sauce. Teriyaki sauce. Soy sauce, including reduced-sodium soy sauce. Steak sauce. Canned and packaged gravies. Fish sauce. Oyster sauce. Cocktail sauce. Store-bought horseradish. Ketchup. Mustard. Meat flavorings and tenderizers. Bouillon cubes. Hot sauces. Pre-made or packaged marinades. Pre-made or packaged taco seasonings. Relishes. Regular salad dressings. Other foods Salted popcorn and pretzels. The items listed above may not be all the foods and drinks you should avoid. Talk to a dietitian to learn more. Where to find more information National Heart, Lung, and Blood Institute (NHLBI): buffalodrycleaner.gl American Heart Association (AHA): heart.org Academy of Nutrition and Dietetics: eatright.org National Kidney Foundation (NKF): kidney.org This information is not intended to replace advice given to you by your health care provider. Make sure you discuss any questions you have with your health care provider. Document Revised: 06/04/2022 Document Reviewed: 06/04/2022 Elsevier Patient Education  2024 Elsevier Inc.   If you have been  instructed to have an in-person evaluation today at a local Urgent Care facility, please use the link below. It will take you to a list of all of our available [facility], including address, phone number and hours of operation. Please do not delay care.  [facility]  If you or a family member do not have a primary care provider, use the link below to schedule a visit and establish care. When you choose a [facility] primary care physician or advanced practice provider, you gain a long-term partner in health. Find a Primary Care Provider  Learn more about [facility]'s in-office and virtual care options: [facility] - Get Care Now

## 2024-05-04 NOTE — Progress Notes (Signed)
 Virtual Visit Consent   Vela Render, you are scheduled for a virtual visit with a [facility] provider today. Just as with appointments in the office, your consent must be obtained to participate. Your consent will be active for this visit and any virtual visit you may have with one of our providers in the next 365 days. If you have a MyChart account, a copy of this consent can be sent to you electronically.  As this is a virtual visit, video technology does not allow for your provider to perform a traditional examination. This may limit your provider's ability to fully assess your condition. If your provider identifies any concerns that need to be evaluated in person or the need to arrange testing (such as labs, EKG, etc.), we will make arrangements to do so. Although advances in technology are sophisticated, we cannot ensure that it will always work on either your end or our end. If the connection with a video visit is poor, the visit may have to be switched to a telephone visit. With either a video or telephone visit, we are not always able to ensure that we have a secure connection.  By engaging in this virtual visit, you consent to the provision of healthcare and authorize for your insurance to be billed (if applicable) for the services provided during this visit. Depending on your insurance coverage, you may receive a charge related to this service.  I need to obtain your verbal consent now. Are you willing to proceed with your visit today? [name] has provided verbal consent on 05/04/2024 for a virtual visit (video or telephone). Jennifer Hatfield, [place]  Date: 05/04/2024 7:37 PM   Virtual Visit via Video Note   I, Jennifer Hatfield, connected with  Jennifer Hatfield  ([id number], Aug 15, 1975) on 05/04/24 at  7:30 PM EST by a video-enabled telemedicine application and verified that I am speaking with the correct person using two identifiers.  Location: Patient:  Virtual Visit Location Patient: Home Provider: Virtual Visit Location Provider: Home Office   I discussed the limitations of evaluation and management by telemedicine and the availability of in person appointments. The patient expressed understanding and agreed to proceed.    History of Present Illness: Jennifer Hatfield is a 48 y.o. who identifies as a female who was assigned female at birth, and is being seen today for questions regarding her antihypertensive medication.  Patient notes she was previously on a regimen of amlodipine  for hypertension.  When she had a follow-up with her cardiologist she mentions some swelling of her feet and ankles that she had been noting.  As such they stopped her amlodipine  and started her on a prescription for Benicar  40 mg once daily, telling her to take a half a tablet daily.  Was also scheduled for an echocardiogram due to some shortness of breath on exertion she mention to them at time of visit.  Patient notes she did stop the amlodipine  but she never started the new medicine as she was nervous about potential side effects.  Notes she had to switch jobs and had an insurance change, so has not been able to have her echocardiogram completed as of this time.  She notes she had been to check her blood pressure today and noted it was at 134/70 despite not taking any medication in a few months. Patient denies chest pain, palpitations, lightheadedness, dizziness, vision changes or frequent headaches.   HPI: HPI  Problems:  Patient Active Problem List  Diagnosis Date Noted   Essential hypertension 05/04/2024   Hypercholesteremia 05/04/2024   Fibroid uterus 11/25/2012    Allergies:  Allergies  Allergen Reactions   Latex    Medications:  Current Outpatient Medications:    ibuprofen  (ADVIL ,MOTRIN ) 600 MG tablet, Take 1 tablet (600 mg total) by mouth every 6 (six) hours as needed., Disp: 30 tablet, Rfl: 0   olmesartan  (BENICAR ) 40 MG tablet, Take 1 tablet  (40 mg total) by mouth daily., Disp: 90 tablet, Rfl: 3  Observations/Objective: Patient is well-developed, well-nourished in no acute distress.  Resting comfortably  at home.  Head is normocephalic, atraumatic.  No labored breathing.  Speech is clear and coherent with logical content.  Patient is alert and oriented at baseline.    Assessment and Plan: 1. Essential hypertension (Primary)  Discussed unfortunately 1 isolated blood pressure reading is not enough to determine whether or not she can remain off of her blood pressure medicine.  She has not taken in several months and is thankfully asymptomatic.  Will have her continue to hydrate.  DASH diet recommended.  She is to check blood pressure twice daily over the next week and record findings.  She is also to follow-up with her cardiologist with those numbers so they can make further determinations about needed treatments for hypertension.  She is also to call them first thing in the morning to get her echocardiogram scheduled that was ordered at her visit with cardiology in August.  Follow Up Instructions: I discussed the assessment and treatment plan with the patient. The patient was provided an opportunity to ask questions and all were answered. The patient agreed with the plan and demonstrated an understanding of the instructions.  A copy of instructions were sent to the patient via MyChart unless otherwise noted below.    The patient was advised to call back or seek an in-person evaluation if the symptoms worsen or if the condition fails to improve as anticipated.    Jennifer Velma Lunger, PA-C

## 2024-05-05 ENCOUNTER — Ambulatory Visit: Payer: Self-pay

## 2024-05-31 ENCOUNTER — Ambulatory Visit
Admission: RE | Admit: 2024-05-31 | Discharge: 2024-05-31 | Disposition: A | Discharge status: Home / Self Care | Payer: Self-pay | Source: Ambulatory Visit | Attending: Family Medicine | Admitting: Family Medicine

## 2024-05-31 DIAGNOSIS — Z1231 Encounter for screening mammogram for malignant neoplasm of breast: Secondary | ICD-10-CM

## 2024-06-01 ENCOUNTER — Other Ambulatory Visit: Payer: Self-pay | Admitting: Medical Genetics

## 2024-06-07 ENCOUNTER — Ambulatory Visit: Payer: Self-pay | Admitting: Family Medicine

## 2024-06-07 NOTE — Progress Notes (Signed)
"  Negative mammogram.   "

## 2024-06-13 ENCOUNTER — Encounter (HOSPITAL_COMMUNITY): Payer: Self-pay

## 2024-06-14 ENCOUNTER — Ambulatory Visit (HOSPITAL_COMMUNITY)

## 2024-06-28 ENCOUNTER — Ambulatory Visit (HOSPITAL_COMMUNITY)
Admission: RE | Admit: 2024-06-28 | Discharge: 2024-06-28 | Disposition: A | Discharge status: Home / Self Care | Source: Ambulatory Visit | Attending: Cardiovascular Disease | Admitting: Cardiovascular Disease

## 2024-06-28 DIAGNOSIS — M7989 Other specified soft tissue disorders: Secondary | ICD-10-CM | POA: Insufficient documentation

## 2024-06-28 DIAGNOSIS — R0609 Other forms of dyspnea: Secondary | ICD-10-CM | POA: Diagnosis present

## 2024-06-28 LAB — ECHOCARDIOGRAM COMPLETE
Area-P 1/2: 5.16 cm2
S' Lateral: 2.49 cm

## 2024-07-03 ENCOUNTER — Ambulatory Visit: Payer: Self-pay | Admitting: Physician Assistant
